# Patient Record
Sex: Female | Born: 1980 | Race: White | Hispanic: No | Marital: Married | Smoking: Former smoker
Health system: Southern US, Community
[De-identification: ages and names within clinical notes are randomized; demographics above are authoritative.]

## PROBLEM LIST (undated history)

## (undated) ENCOUNTER — Inpatient Hospital Stay (HOSPITAL_COMMUNITY): Payer: Self-pay

## (undated) DIAGNOSIS — F419 Anxiety disorder, unspecified: Secondary | ICD-10-CM

## (undated) DIAGNOSIS — K219 Gastro-esophageal reflux disease without esophagitis: Secondary | ICD-10-CM

## (undated) DIAGNOSIS — E039 Hypothyroidism, unspecified: Secondary | ICD-10-CM

## (undated) DIAGNOSIS — O139 Gestational [pregnancy-induced] hypertension without significant proteinuria, unspecified trimester: Secondary | ICD-10-CM

## (undated) DIAGNOSIS — N62 Hypertrophy of breast: Secondary | ICD-10-CM

## (undated) DIAGNOSIS — I7300 Raynaud's syndrome without gangrene: Secondary | ICD-10-CM

## (undated) DIAGNOSIS — R87629 Unspecified abnormal cytological findings in specimens from vagina: Secondary | ICD-10-CM

## (undated) DIAGNOSIS — R519 Headache, unspecified: Secondary | ICD-10-CM

## (undated) HISTORY — DX: Anxiety disorder, unspecified: F41.9

## (undated) HISTORY — DX: Headache, unspecified: R51.9

## (undated) HISTORY — PX: WISDOM TOOTH EXTRACTION: SHX21

## (undated) HISTORY — PX: DILATION AND CURETTAGE OF UTERUS: SHX78

## (undated) HISTORY — DX: Unspecified abnormal cytological findings in specimens from vagina: R87.629

## (undated) HISTORY — PX: CERVICAL BIOPSY  W/ LOOP ELECTRODE EXCISION: SUR135

## (undated) HISTORY — DX: Raynaud's syndrome without gangrene: I73.00

---
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
## 2001-03-12 ENCOUNTER — Emergency Department (HOSPITAL_COMMUNITY): Admission: EM | Admit: 2001-03-12 | Discharge: 2001-03-13 | Payer: Self-pay | Admitting: Emergency Medicine

## 2001-04-22 ENCOUNTER — Emergency Department (HOSPITAL_COMMUNITY): Admission: EM | Admit: 2001-04-22 | Discharge: 2001-04-22 | Payer: Self-pay | Admitting: Emergency Medicine

## 2001-07-17 ENCOUNTER — Emergency Department (HOSPITAL_COMMUNITY): Admission: EM | Admit: 2001-07-17 | Discharge: 2001-07-17 | Payer: Self-pay | Admitting: Emergency Medicine

## 2002-01-07 ENCOUNTER — Emergency Department (HOSPITAL_COMMUNITY): Admission: EM | Admit: 2002-01-07 | Discharge: 2002-01-07 | Payer: Self-pay | Admitting: *Deleted

## 2002-04-21 ENCOUNTER — Other Ambulatory Visit: Admission: RE | Admit: 2002-04-21 | Discharge: 2002-04-21 | Payer: Self-pay | Admitting: Obstetrics and Gynecology

## 2002-12-13 ENCOUNTER — Other Ambulatory Visit: Admission: RE | Admit: 2002-12-13 | Discharge: 2002-12-13 | Payer: Self-pay | Admitting: Obstetrics and Gynecology

## 2003-10-25 ENCOUNTER — Other Ambulatory Visit: Admission: RE | Admit: 2003-10-25 | Discharge: 2003-10-25 | Payer: Self-pay | Admitting: Obstetrics and Gynecology

## 2004-04-26 ENCOUNTER — Other Ambulatory Visit: Admission: RE | Admit: 2004-04-26 | Discharge: 2004-04-26 | Payer: Self-pay | Admitting: Obstetrics and Gynecology

## 2005-02-15 ENCOUNTER — Emergency Department (HOSPITAL_COMMUNITY): Admission: EM | Admit: 2005-02-15 | Discharge: 2005-02-15 | Payer: Self-pay | Admitting: Emergency Medicine

## 2005-06-26 ENCOUNTER — Inpatient Hospital Stay (HOSPITAL_COMMUNITY): Admission: AD | Admit: 2005-06-26 | Discharge: 2005-06-26 | Payer: Self-pay | Admitting: Obstetrics and Gynecology

## 2005-07-31 ENCOUNTER — Other Ambulatory Visit: Admission: RE | Admit: 2005-07-31 | Discharge: 2005-07-31 | Payer: Self-pay | Admitting: Obstetrics and Gynecology

## 2005-09-03 ENCOUNTER — Inpatient Hospital Stay (HOSPITAL_COMMUNITY): Admission: AD | Admit: 2005-09-03 | Discharge: 2005-09-03 | Payer: Self-pay | Admitting: Obstetrics and Gynecology

## 2005-09-24 ENCOUNTER — Inpatient Hospital Stay (HOSPITAL_COMMUNITY): Admission: AD | Admit: 2005-09-24 | Discharge: 2005-09-24 | Payer: Self-pay | Admitting: Obstetrics and Gynecology

## 2005-09-28 ENCOUNTER — Inpatient Hospital Stay (HOSPITAL_COMMUNITY): Admission: AD | Admit: 2005-09-28 | Discharge: 2005-10-02 | Payer: Self-pay | Admitting: Obstetrics and Gynecology

## 2005-10-10 ENCOUNTER — Inpatient Hospital Stay (HOSPITAL_COMMUNITY): Admission: AD | Admit: 2005-10-10 | Discharge: 2005-10-10 | Payer: Self-pay | Admitting: Obstetrics and Gynecology

## 2005-12-22 ENCOUNTER — Other Ambulatory Visit: Admission: RE | Admit: 2005-12-22 | Discharge: 2005-12-22 | Payer: Self-pay | Admitting: Obstetrics and Gynecology

## 2007-01-27 ENCOUNTER — Other Ambulatory Visit: Admission: RE | Admit: 2007-01-27 | Discharge: 2007-01-27 | Payer: Self-pay | Admitting: Family Medicine

## 2007-04-12 ENCOUNTER — Emergency Department (HOSPITAL_COMMUNITY): Admission: EM | Admit: 2007-04-12 | Discharge: 2007-04-12 | Payer: Self-pay | Admitting: Emergency Medicine

## 2009-03-09 ENCOUNTER — Other Ambulatory Visit: Admission: RE | Admit: 2009-03-09 | Discharge: 2009-03-09 | Payer: Self-pay | Admitting: Family Medicine

## 2009-04-16 ENCOUNTER — Encounter: Payer: Self-pay | Admitting: Cardiology

## 2009-05-10 ENCOUNTER — Ambulatory Visit: Payer: Self-pay | Admitting: Cardiology

## 2009-05-10 DIAGNOSIS — F17200 Nicotine dependence, unspecified, uncomplicated: Secondary | ICD-10-CM

## 2009-05-10 DIAGNOSIS — E785 Hyperlipidemia, unspecified: Secondary | ICD-10-CM

## 2009-12-03 ENCOUNTER — Telehealth: Payer: Self-pay | Admitting: Cardiology

## 2009-12-04 ENCOUNTER — Ambulatory Visit: Payer: Self-pay | Admitting: Cardiology

## 2009-12-13 ENCOUNTER — Encounter: Payer: Self-pay | Admitting: Cardiology

## 2009-12-13 LAB — CONVERTED CEMR LAB
AST: 20 units/L (ref 0–37)
Albumin: 4.2 g/dL (ref 3.5–5.2)
Bilirubin, Direct: 0.1 mg/dL (ref 0.0–0.3)
LDL Cholesterol: 99 mg/dL (ref 0–99)
Triglycerides: 83 mg/dL (ref 0.0–149.0)
VLDL: 16.6 mg/dL (ref 0.0–40.0)

## 2010-10-22 NOTE — Letter (Signed)
Summary: Custom - Lipid  [facility], Main Office  [address]   [place], [place] [address]   Phone: [phone]  Fax: [phone]         December 13, 2009 MRN: [id number]     Tracy Tran [address] [place], [place]  [address]     Dear [name],    We have reviewed your cholesterol results.  They are as follows:     Total Cholesterol:    174 (Desirable: less than 200)       HDL  Cholesterol:     58.40  (Desirable: greater than 40 for men and 50 for women)       LDL Cholesterol:       99  (Desirable: less than 100 for low risk and less than 70 for moderate to high risk)       Triglycerides:       83.0  (Desirable: less than 150)  Our recommendations include:  Continue the same.  This is excellent!!   Call our office at the number listed above if you have any questions.  Lowering your LDL cholesterol is important, but it is only one of a large number of "risk factors" that may indicate that you are at risk for heart disease, stroke or other complications of hardening of the arteries.  Other risk factors include:   A.  Cigarette Smoking* B.  High Blood Pressure* C.  Obesity* D.   Low HDL Cholesterol (see yours above)* E.   Diabetes Mellitus (higher risk if your is uncontrolled) F.  Family history of premature heart disease G.  Previous history of stroke or cardiovascular disease        *These are risk factors YOU HAVE CONTROL OVER.  For more information, visit .  There is now evidence that lowering the TOTAL CHOLESTEROL AND LDL CHOLESTEROL can reduce the risk of heart disease.  The American Heart Association recommends the following guidelines for the treatment of elevated cholesterol:  1.  If there is now current heart disease and less than two risk factors, TOTAL CHOLESTEROL should be less than 200 and LDL CHOLESTEROL should be less than 100. 2.  If there is current heart disease or two or more risk factors, TOTAL CHOLESTEROL should  be less than 200 and LDL CHOLESTEROL should be less than 70.  A diet low in cholesterol, saturated fat, and calories is the cornerstone of treatment for elevated cholesterol.  Cessation of smoking and exercise are also important in the management of elevated cholesterol and preventing vascular disease.  Studies have shown that 30 to 60 minutes of physical activity most days can help lower blood pressure, lower cholesterol, and keep your weight at a healthy level.  Drug therapy is used when cholesterol levels do not respond to therapeutic lifestyle changes (smoking cessation, diet, and exercise) and remains unacceptably high.  If medication is started, it is important to have you levels checked periodically to evaluate the need for further treatment options.    Thank you,   Sander Nephew, RN for  Dr Roll[facility] Team

## 2010-10-22 NOTE — Progress Notes (Signed)
Summary: CALLING ABOUT LABS coming in 12/04/2009  Phone Note Call from Patient Call back at Work Phone [phone]   Caller: Patient Summary of Call: PT CALLING WITH QUESTIONS ABOUT HAVING LABS DRAWN FOR LIVER/LIPID Initial call taken by: Judie Grieve,  December 03, 2009 9:54 AM  Follow-up for Phone Call        wants to have fasting lipid and liver profile in am. order will be placed in system.  pt never came in for f/u 8 week labs after starting Lipitor Follow-up by: Charolotte Capuchin, RN,  December 03, 2009 10:08 AM

## 2014-02-28 LAB — OB RESULTS CONSOLE GC/CHLAMYDIA
CHLAMYDIA, DNA PROBE: NEGATIVE
Gonorrhea: NEGATIVE

## 2014-02-28 LAB — OB RESULTS CONSOLE ABO/RH: RH Type: POSITIVE

## 2014-02-28 LAB — OB RESULTS CONSOLE ANTIBODY SCREEN: Antibody Screen: NEGATIVE

## 2014-02-28 LAB — OB RESULTS CONSOLE RUBELLA ANTIBODY, IGM: RUBELLA: IMMUNE

## 2014-02-28 LAB — OB RESULTS CONSOLE HEPATITIS B SURFACE ANTIGEN: HEP B S AG: NEGATIVE

## 2014-02-28 LAB — OB RESULTS CONSOLE HIV ANTIBODY (ROUTINE TESTING): HIV: NONREACTIVE

## 2014-02-28 LAB — OB RESULTS CONSOLE RPR: RPR: NONREACTIVE

## 2014-09-27 ENCOUNTER — Encounter (HOSPITAL_COMMUNITY): Payer: Self-pay | Admitting: *Deleted

## 2014-09-27 ENCOUNTER — Telehealth (HOSPITAL_COMMUNITY): Payer: Self-pay | Admitting: *Deleted

## 2014-09-27 NOTE — Telephone Encounter (Signed)
Preadmission screen  

## 2014-10-01 ENCOUNTER — Inpatient Hospital Stay (HOSPITAL_COMMUNITY)
Admission: RE | Admit: 2014-10-01 | Discharge: 2014-10-03 | DRG: 775 | Disposition: A | Discharge status: Home / Self Care | Payer: BLUE CROSS/BLUE SHIELD | Source: Ambulatory Visit | Attending: Obstetrics and Gynecology | Admitting: Obstetrics and Gynecology

## 2014-10-01 ENCOUNTER — Encounter (HOSPITAL_COMMUNITY): Payer: Self-pay

## 2014-10-01 DIAGNOSIS — Z3A39 39 weeks gestation of pregnancy: Secondary | ICD-10-CM | POA: Diagnosis present

## 2014-10-01 DIAGNOSIS — IMO0002 Reserved for concepts with insufficient information to code with codable children: Secondary | ICD-10-CM | POA: Diagnosis present

## 2014-10-01 DIAGNOSIS — O365930 Maternal care for other known or suspected poor fetal growth, third trimester, not applicable or unspecified: Secondary | ICD-10-CM | POA: Diagnosis present

## 2014-10-01 DIAGNOSIS — Z3483 Encounter for supervision of other normal pregnancy, third trimester: Secondary | ICD-10-CM | POA: Diagnosis present

## 2014-10-01 LAB — CBC
HEMATOCRIT: 33.6 % — AB (ref 36.0–46.0)
HEMOGLOBIN: 11.3 g/dL — AB (ref 12.0–15.0)
MCH: 30.2 pg (ref 26.0–34.0)
MCHC: 33.6 g/dL (ref 30.0–36.0)
MCV: 89.8 fL (ref 78.0–100.0)
PLATELETS: 229 10*3/uL (ref 150–400)
RBC: 3.74 MIL/uL — ABNORMAL LOW (ref 3.87–5.11)
RDW: 13.1 % (ref 11.5–15.5)
WBC: 12.4 10*3/uL — AB (ref 4.0–10.5)

## 2014-10-01 LAB — ABO/RH: ABO/RH(D): AB POS

## 2014-10-01 LAB — TYPE AND SCREEN
ABO/RH(D): AB POS
Antibody Screen: NEGATIVE

## 2014-10-01 LAB — OB RESULTS CONSOLE GBS: STREP GROUP B AG: NEGATIVE

## 2014-10-01 MED ORDER — CITRIC ACID-SODIUM CITRATE 334-500 MG/5ML PO SOLN: 30.0000 mL | ORAL | Status: DC | PRN

## 2014-10-01 MED ORDER — TERBUTALINE SULFATE 1 MG/ML IJ SOLN: 0.2500 mg | Freq: Once | INTRAMUSCULAR | Status: AC | PRN

## 2014-10-01 MED ORDER — OXYCODONE-ACETAMINOPHEN 5-325 MG PO TABS: 2.0000 | ORAL_TABLET | ORAL | Status: DC | PRN

## 2014-10-01 MED ORDER — OXYTOCIN 40 UNITS IN LACTATED RINGERS INFUSION - SIMPLE MED
62.5000 mL/h | INTRAVENOUS | Status: DC
  Administered 2014-10-02: 62.5 mL/h via INTRAVENOUS
  Filled 2014-10-01: qty 1000

## 2014-10-01 MED ORDER — MISOPROSTOL 25 MCG QUARTER TABLET
25.0000 ug | ORAL_TABLET | ORAL | Status: DC | PRN
  Administered 2014-10-01 – 2014-10-02 (×2): 25 ug via VAGINAL
  Filled 2014-10-01: qty 1
  Filled 2014-10-01 (×2): qty 0.25

## 2014-10-01 MED ORDER — LACTATED RINGERS IV SOLN: 500.0000 mL | INTRAVENOUS | Status: DC | PRN

## 2014-10-01 MED ORDER — OXYTOCIN BOLUS FROM INFUSION
500.0000 mL | INTRAVENOUS | Status: DC
  Administered 2014-10-02: 500 mL via INTRAVENOUS

## 2014-10-01 MED ORDER — ACETAMINOPHEN 325 MG PO TABS: 650.0000 mg | ORAL_TABLET | ORAL | Status: DC | PRN

## 2014-10-01 MED ORDER — FLEET ENEMA 7-19 GM/118ML RE ENEM: 1.0000 | ENEMA | RECTAL | Status: DC | PRN

## 2014-10-01 MED ORDER — OXYCODONE-ACETAMINOPHEN 5-325 MG PO TABS: 1.0000 | ORAL_TABLET | ORAL | Status: DC | PRN

## 2014-10-01 MED ORDER — ZOLPIDEM TARTRATE 5 MG PO TABS: 5.0000 mg | ORAL_TABLET | Freq: Every evening | ORAL | Status: DC | PRN

## 2014-10-01 MED ORDER — LACTATED RINGERS IV SOLN
INTRAVENOUS | Status: DC
  Administered 2014-10-01: 20:00:00 via INTRAVENOUS

## 2014-10-01 MED ORDER — LIDOCAINE HCL (PF) 1 % IJ SOLN
30.0000 mL | INTRAMUSCULAR | Status: DC | PRN
  Filled 2014-10-01: qty 30

## 2014-10-01 MED ORDER — ONDANSETRON HCL 4 MG/2ML IJ SOLN: 4.0000 mg | Freq: Four times a day (QID) | INTRAMUSCULAR | Status: DC | PRN

## 2014-10-02 ENCOUNTER — Inpatient Hospital Stay (HOSPITAL_COMMUNITY): Payer: BLUE CROSS/BLUE SHIELD | Admitting: Anesthesiology

## 2014-10-02 ENCOUNTER — Encounter (HOSPITAL_COMMUNITY): Payer: Self-pay

## 2014-10-02 MED ORDER — DIPHENHYDRAMINE HCL 25 MG PO CAPS: 25.0000 mg | ORAL_CAPSULE | Freq: Four times a day (QID) | ORAL | Status: DC | PRN

## 2014-10-02 MED ORDER — LACTATED RINGERS IV SOLN: 500.0000 mL | Freq: Once | INTRAVENOUS | Status: DC

## 2014-10-02 MED ORDER — ZOLPIDEM TARTRATE 5 MG PO TABS: 5.0000 mg | ORAL_TABLET | Freq: Every evening | ORAL | Status: DC | PRN

## 2014-10-02 MED ORDER — PHENYLEPHRINE 40 MCG/ML (10ML) SYRINGE FOR IV PUSH (FOR BLOOD PRESSURE SUPPORT)
80.0000 ug | PREFILLED_SYRINGE | INTRAVENOUS | Status: DC | PRN
Start: 2014-10-02 — End: 2014-10-02
  Filled 2014-10-02: qty 2

## 2014-10-02 MED ORDER — EPHEDRINE 5 MG/ML INJ
10.0000 mg | INTRAVENOUS | Status: DC | PRN
  Filled 2014-10-02: qty 2

## 2014-10-02 MED ORDER — PRENATAL MULTIVITAMIN CH
1.0000 | ORAL_TABLET | Freq: Every day | ORAL | Status: DC
  Administered 2014-10-03: 1 via ORAL
  Filled 2014-10-02: qty 1

## 2014-10-02 MED ORDER — IBUPROFEN 600 MG PO TABS
600.0000 mg | ORAL_TABLET | Freq: Four times a day (QID) | ORAL | Status: DC
  Administered 2014-10-02 – 2014-10-03 (×5): 600 mg via ORAL
  Filled 2014-10-02 (×5): qty 1

## 2014-10-02 MED ORDER — DIBUCAINE 1 % RE OINT: 1.0000 "application " | TOPICAL_OINTMENT | RECTAL | Status: DC | PRN

## 2014-10-02 MED ORDER — OXYCODONE-ACETAMINOPHEN 5-325 MG PO TABS: 1.0000 | ORAL_TABLET | ORAL | Status: DC | PRN

## 2014-10-02 MED ORDER — SIMETHICONE 80 MG PO CHEW: 80.0000 mg | CHEWABLE_TABLET | ORAL | Status: DC | PRN

## 2014-10-02 MED ORDER — BISACODYL 10 MG RE SUPP: 10.0000 mg | Freq: Every day | RECTAL | Status: DC | PRN

## 2014-10-02 MED ORDER — ONDANSETRON HCL 4 MG PO TABS: 4.0000 mg | ORAL_TABLET | ORAL | Status: DC | PRN

## 2014-10-02 MED ORDER — FLEET ENEMA 7-19 GM/118ML RE ENEM: 1.0000 | ENEMA | Freq: Every day | RECTAL | Status: DC | PRN

## 2014-10-02 MED ORDER — DIPHENHYDRAMINE HCL 50 MG/ML IJ SOLN: 12.5000 mg | INTRAMUSCULAR | Status: DC | PRN

## 2014-10-02 MED ORDER — LANOLIN HYDROUS EX OINT: TOPICAL_OINTMENT | CUTANEOUS | Status: DC | PRN

## 2014-10-02 MED ORDER — WITCH HAZEL-GLYCERIN EX PADS: 1.0000 "application " | MEDICATED_PAD | CUTANEOUS | Status: DC | PRN

## 2014-10-02 MED ORDER — ONDANSETRON HCL 4 MG/2ML IJ SOLN: 4.0000 mg | INTRAMUSCULAR | Status: DC | PRN

## 2014-10-02 MED ORDER — BENZOCAINE-MENTHOL 20-0.5 % EX AERO
1.0000 "application " | INHALATION_SPRAY | CUTANEOUS | Status: DC | PRN
  Administered 2014-10-02: 1 via TOPICAL
  Filled 2014-10-02: qty 56

## 2014-10-02 MED ORDER — OXYCODONE-ACETAMINOPHEN 5-325 MG PO TABS: 2.0000 | ORAL_TABLET | ORAL | Status: DC | PRN

## 2014-10-02 MED ORDER — SENNOSIDES-DOCUSATE SODIUM 8.6-50 MG PO TABS
2.0000 | ORAL_TABLET | ORAL | Status: DC
  Administered 2014-10-03: 2 via ORAL
  Filled 2014-10-02: qty 2

## 2014-10-02 MED ORDER — LIDOCAINE HCL (PF) 1 % IJ SOLN
INTRAMUSCULAR | Status: DC | PRN
Start: 2014-10-02 — End: 2014-10-03
  Administered 2014-10-02 (×2): 5 mL

## 2014-10-02 MED ORDER — FENTANYL 2.5 MCG/ML BUPIVACAINE 1/10 % EPIDURAL INFUSION (WH - ANES)
14.0000 mL/h | INTRAMUSCULAR | Status: DC | PRN
  Administered 2014-10-02: 14 mL/h via EPIDURAL
  Filled 2014-10-02: qty 125

## 2014-10-02 MED ORDER — PHENYLEPHRINE 40 MCG/ML (10ML) SYRINGE FOR IV PUSH (FOR BLOOD PRESSURE SUPPORT)
80.0000 ug | PREFILLED_SYRINGE | INTRAVENOUS | Status: DC | PRN
  Filled 2014-10-02: qty 20
  Filled 2014-10-02: qty 2

## 2014-10-02 MED ORDER — TETANUS-DIPHTH-ACELL PERTUSSIS 5-2.5-18.5 LF-MCG/0.5 IM SUSP
0.5000 mL | Freq: Once | INTRAMUSCULAR | Status: DC
Start: 2014-10-03 — End: 2014-10-03

## 2014-10-02 NOTE — Progress Notes (Signed)
Delivery Note At 10:57 AM a viable female was delivered via  (Presentation:LOA ;  ).  APGAR: , ; weight  .   Placenta status:intact , .  Cord: 3 vessels with the following complications:loose nuchal cord x1 .  Cord pH: pending  Anesthesia:   Episiotomy:   Lacerations: second degree midline lac Suture Repair: 2.0 vicryl rapide Est. Blood Loss (mL):    Mom to postpartum.  Baby to Couplet care / Skin to Skin.  Glenisha Gundry II,Phoua Hoadley E 10/02/2014, 11:12 AM

## 2014-10-02 NOTE — Anesthesia Preprocedure Evaluation (Signed)
Anesthesia Evaluation  Patient identified by MRN, date of birth, ID band Patient awake    Reviewed: Allergy & Precautions, H&P , Patient's Chart, lab work & pertinent test results  Airway Mallampati: II TM Distance: >3 FB Neck ROM: full    Dental   Pulmonary  breath sounds clear to auscultation        Cardiovascular Rhythm:regular Rate:Normal     Neuro/Psych PSYCHIATRIC DISORDERS    GI/Hepatic   Endo/Other    Renal/GU      Musculoskeletal   Abdominal   Peds  Hematology   Anesthesia Other Findings   Reproductive/Obstetrics (+) Pregnancy                           Anesthesia Physical Anesthesia Plan  ASA: II  Anesthesia Plan: Epidural   Post-op Pain Management:    Induction:   Airway Management Planned:   Additional Equipment:   Intra-op Plan:   Post-operative Plan:   Informed Consent: I have reviewed the patients History and Physical, chart, labs and discussed the procedure including the risks, benefits and alternatives for the proposed anesthesia with the patient or authorized representative who has indicated his/her understanding and acceptance.     Plan Discussed with:   Anesthesia Plan Comments:         Anesthesia Quick Evaluation  

## 2014-10-02 NOTE — Anesthesia Postprocedure Evaluation (Signed)
  Anesthesia Post-op Note  Patient: Tracy GordonHannah M Byrd  Procedure(s) Performed: * No procedures listed *  Patient Location: PACU and Mother/Baby  Anesthesia Type:Epidural  Level of Consciousness: awake, alert  and oriented  Airway and Oxygen Therapy: Patient Spontanous Breathing  Post-op Pain: none  Post-op Assessment: Post-op Vital signs reviewed  Post-op Vital Signs: Reviewed and stable  Last Vitals:  Filed Vitals:   10/02/14 1504  BP: 108/64  Pulse: 84  Temp: 36.3 C  Resp: 16    Complications: No apparent anesthesia complications

## 2014-10-02 NOTE — H&P (Signed)
Tracy GordonHannah M Tran is a 34 y.o. female presenting for IOL secondary to IUGR with EFW @ 8% (5# 8oz) on 09/21/14. Dopplers and BPP normal. Maternal Medical History:  Fetal activity: Perceived fetal activity is normal.      OB History    Gravida Para Term Preterm AB TAB SAB Ectopic Multiple Living   3 1 1  1     1      Past Medical History  Diagnosis Date  . Vaginal Pap smear, abnormal   . Anxiety    Past Surgical History  Procedure Laterality Date  . Dilation and curettage of uterus     Family History: family history includes Diabetes in her maternal grandfather; Heart disease in her father and mother; Hypertension in her father and mother; Thyroid disease in her mother. Social History:  reports that she has never smoked. She has never used smokeless tobacco. She reports that she does not drink alcohol or use illicit drugs.   Prenatal Transfer Tool  Maternal Diabetes: No Genetic Screening: Normal Maternal Ultrasounds/Referrals: Normal Fetal Ultrasounds or other Referrals:  None Maternal Substance Abuse:  No Significant Maternal Medications:  None Significant Maternal Lab Results:  None Other Comments:  None  Review of Systems  Eyes: Negative for blurred vision.  Gastrointestinal: Negative for abdominal pain.  Neurological: Negative for headaches.    Dilation: 2 Effacement (%): Thick Station: -2 Exam by:: ansah-mensah, rnc Blood pressure 98/83, pulse 88, temperature 98.5 F (36.9 C), temperature source Oral, resp. rate 16, height 5\' 7"  (1.702 m), weight 177 lb (80.287 kg), last menstrual period 12/29/2013. Maternal Exam:  Uterine Assessment: Contraction strength is moderate.  Contraction frequency is irregular.   Abdomen: Fetal presentation: vertex     Fetal Exam Fetal State Assessment: Category I - tracings are normal.     Physical Exam  Cardiovascular: Normal rate and regular rhythm.   Respiratory: Effort normal and breath sounds normal.  GI: Soft. There is no  tenderness.  Neurological: She has normal reflexes.    Cx 3/80/-2/vtx + show AROM clear, trickle due to well applied vtx  Prenatal labs: ABO, Rh: --/--/AB POS, AB POS (01/10 2030) Antibody: NEG (01/10 2030) Rubella: Immune (06/09 0000) RPR: Nonreactive (06/09 0000)  HBsAg: Negative (06/09 0000)  HIV: Non-reactive (06/09 0000)  GBS: Negative (01/10 0000)   Assessment/Plan: 34 yo with IUGR @ 39 4/7 weeks S/P cytotec x 2 last pm Reviewed with patient and husband induction and risks including fetal distress, emergency cesarean section Patient states she understands and agrees, all questions answered.   Shermaine Rivet II,Dayja Loveridge E 10/02/2014, 8:41 AM

## 2014-10-02 NOTE — Lactation Note (Signed)
This note was copied from the chart of Tracy Tran. Lactation Consultation Note Initial visit at 11 hours of age.  Mom reports several good feedings.  Mom denies pain with latch.  [facility] given and discussed.  Encouraged to feed with early cues on demand.  Early newborn behavior discussed.  Hand expression demonstrated by mom with colostrum visible.  Mom to call for assist as needed.    Patient Name: Tracy Tran UJWJX'BToday's Date: 10/02/2014 Reason for consult: Initial assessment   Maternal Data Has patient been taught Hand Expression?: Yes Does the patient have breastfeeding experience prior to this delivery?: Yes  Feeding Feeding Type: Breast Fed Length of feed: 10 min  LATCH Score/Interventions                Intervention(s): Breastfeeding basics reviewed     Lactation Tools Discussed/Used     Consult Status Consult Status: Follow-up Date: 10/03/14 Follow-up type: In-patient    Beverely RisenShoptaw, Arvella MerlesJana Lynn 10/02/2014, 10:19 PM

## 2014-10-02 NOTE — Anesthesia Procedure Notes (Signed)
Epidural Patient location during procedure: OB Start time: 10/02/2014 10:20 AM  Staffing Anesthesiologist: Brayton CavesJACKSON, Laterria Lasota Performed by: anesthesiologist   Preanesthetic Checklist Completed: patient identified, site marked, surgical consent, pre-op evaluation, timeout performed, IV checked, risks and benefits discussed and monitors and equipment checked  Epidural Patient position: sitting Prep: site prepped and draped and DuraPrep Patient monitoring: continuous pulse ox and blood pressure Approach: midline Location: L3-L4 Injection technique: LOR air  Needle:  Needle type: Tuohy  Needle gauge: 17 G Needle length: 9 cm and 9 Needle insertion depth: 5 cm cm Catheter type: closed end flexible Catheter size: 19 Gauge Catheter at skin depth: 10 cm Test dose: negative  Assessment Events: blood not aspirated, injection not painful, no injection resistance, negative IV test and no paresthesia  Additional Notes Patient identified.  Risk benefits discussed including failed block, incomplete pain control, headache, nerve damage, paralysis, blood pressure changes, nausea, vomiting, reactions to medication both toxic or allergic, and postpartum back pain.  Patient expressed understanding and wished to proceed.  All questions were answered.  Sterile technique used throughout procedure and epidural site dressed with sterile barrier dressing. No paresthesia or other complications noted.The patient did not experience any signs of intravascular injection such as tinnitus or metallic taste in mouth nor signs of intrathecal spread such as rapid motor block. Please see nursing notes for vital signs.

## 2014-10-03 LAB — CBC
HCT: 29.4 % — ABNORMAL LOW (ref 36.0–46.0)
Hemoglobin: 9.9 g/dL — ABNORMAL LOW (ref 12.0–15.0)
MCH: 30.4 pg (ref 26.0–34.0)
MCHC: 33.7 g/dL (ref 30.0–36.0)
MCV: 90.2 fL (ref 78.0–100.0)
Platelets: 198 10*3/uL (ref 150–400)
RBC: 3.26 MIL/uL — ABNORMAL LOW (ref 3.87–5.11)
RDW: 13.2 % (ref 11.5–15.5)
WBC: 13.4 10*3/uL — AB (ref 4.0–10.5)

## 2014-10-03 LAB — RPR: RPR: NONREACTIVE

## 2014-10-03 MED ORDER — IBUPROFEN 600 MG PO TABS: 600.0000 mg | ORAL_TABLET | Freq: Four times a day (QID) | ORAL | Status: DC

## 2014-10-03 NOTE — Discharge Summary (Signed)
Obstetric Discharge Summary Reason for Admission: induction of labor Prenatal Procedures: ultrasound Intrapartum Procedures: spontaneous vaginal delivery Postpartum Procedures: none Complications-Operative and Postpartum: 2 degree perineal laceration HEMOGLOBIN  Date Value Ref Range Status  10/03/2014 9.9* 12.0 - 15.0 g/dL Final   HCT  Date Value Ref Range Status  10/03/2014 29.4* 36.0 - 46.0 % Final    Physical Exam:  General: alert and cooperative Lochia: appropriate Uterine Fundus: firm Incision: healing well DVT Evaluation: No evidence of DVT seen on physical exam. Negative Homan's sign. No cords or calf tenderness. No significant calf/ankle edema.  Discharge Diagnoses: Term Pregnancy-delivered  Discharge Information: Date: 10/03/2014 Activity: pelvic rest Diet: routine Medications: PNV and Ibuprofen Condition: stable Instructions: refer to practice specific booklet Discharge to: home   Newborn Data: Live born female  Birth Weight: 6 lb 4 oz (2835 g) APGAR: 9, 9  Home with mother.  Maynard David G 10/03/2014, 8:20 AM

## 2014-10-03 NOTE — Progress Notes (Signed)
Mom states that breastfeeding in going well, mom encouraged to call out for help.  Every time the nurse goes in to see a feeding mom states that she just finished or just come off.  Not able to see a latch this shift.  Encouraged pt to call lactation if any questions arise once at home.

## 2014-10-03 NOTE — Progress Notes (Signed)
Post Partum Day 1 Subjective: no complaints, up ad lib, voiding, tolerating PO and + flatus  Objective: Blood pressure 98/52, pulse 79, temperature 98.5 F (36.9 C), temperature source Oral, resp. rate 16, height 5\' 7"  (1.702 m), weight 177 lb (80.287 kg), last menstrual period 12/29/2013, SpO2 98 %, unknown if currently breastfeeding.  Physical Exam:  General: alert and cooperative Lochia: appropriate Uterine Fundus: firm Incision: healing well DVT Evaluation: No evidence of DVT seen on physical exam. Negative Homan's sign. No cords or calf tenderness. No significant calf/ankle edema.   Recent Labs  10/01/14 2030 10/03/14 0604  HGB 11.3* 9.9*  HCT 33.6* 29.4*    Assessment/Plan: Discharge home   LOS: 2 days   Toneshia Coello G 10/03/2014, 8:14 AM

## 2014-10-03 NOTE — Lactation Note (Signed)
This note was copied from the chart of Tracy Tran. Lactation Consultation Note: Follow up visit with mom before DC. Mom has baby in her arms and states baby has just finished feeding for 10 min. Mom has not let RN observe a feeding since yesterday afternoon. Reports baby has been nursing well- better today than yesterday.Reports some pain with initial latch but eases off after a few minutes. Offered assist but mom states she is sleeping now. No questions at present. Reviewed OP appointments and BFSG as resources for support after DC. To call prn.   Patient Name: Tracy Tran WUJWJ'XToday's Date: 10/03/2014 Reason for consult: Follow-up assessment   Maternal Data Formula Feeding for Exclusion: No Does the patient have breastfeeding experience prior to this delivery?: Yes  Feeding   LATCH Score/Interventions                      Lactation Tools Discussed/Used     Consult Status Consult Status: Complete    Pamelia HoitWeeks, Kemal Amores D 10/03/2014, 11:32 AM

## 2014-10-03 NOTE — Progress Notes (Signed)
Clinical Social Work Department PSYCHOSOCIAL ASSESSMENT - MATERNAL/CHILD 10/03/2014  Patient:  Tracy Tran, Tracy Tran  Account Number:  [id number]  [place] Date:  10/01/2014  Ardine Eng Name:   [facility]   Clinical Social Worker:  Lucita Ferrara, CLINICAL SOCIAL WORKER   Date/Time:  10/03/2014 09:00 AM  Date Referred:  10/03/2014   Referral source  Central Nursery     Referred reason  Depression/Anxiety   Other referral source:    I:  FAMILY / HOME ENVIRONMENT Child's legal guardian:  PARENT  Guardian - Name Guardian - Age Guardian - Address  [address] [place], [place] [address]  Satya Colello  same as above   Other household support members/support persons Name Relationship DOB  Doree Barthel 33 years old   Other support:   MOB reported that their families live out of state, but shared that she is satisfied with her current level of support which includes her friends.    II  PSYCHOSOCIAL DATA Information Source:  Family Interview  Occupational hygienist Employment:   MOB and FOB reported that they are both well supported by their employers.   Financial resources:  Multimedia programmer If [place]:    School / Grade:  N/A Music therapist / Child Services Coordination / Early Interventions:   None reported  Cultural issues impacting care:   None reported    III  STRENGTHS Strengths  Adequate Resources  Home prepared for Child (including basic supplies)  Supportive family/friends   Strength comment:    IV  RISK FACTORS AND CURRENT PROBLEMS Current Problem:  YES   Risk Factor & Current Problem Patient Issue Family Issue Risk Factor / Current Problem Comment  Mental Illness Y N MOB presents with history of anxiety and panic disorders.  She stopped Zoloft with positive UPT, but denied any symptoms during the pregnancy.    V  SOCIAL WORK ASSESSMENT CSW met with the MOB due to history of anxiety and panic attacks.  MOB provided consent  for the FOB and her 66 year old daughter to be present for the visit.  The MOB presented as easily engaged.  She was observed to be breast feeding the baby during the entire visit and displayed a bright and cheerful affect.  MOB did not present with any acute mental health symptoms, and her thought process did not exhibit any acute anxiety.    Per MOB, she was originally overwhelmed when she learned that she was pregnant since it was "unplanned" and it had been numerous years since she has had a newborn. She stated that it was originally difficult for her to adjust to need to "restart" with a new child.  Per MOB, these are the thoughts and stressors that led to her originally feeling "disconnected" from her pregnancy (as noted in her OB records). She stated that she has since adjusted to her new normal given the support from her friends.  MOB expressed that she is now looking forward to the baby's arrival.  Per MOB, the home is prepared and she is satisfied with their level of support that they receive from family, friends, and their employers..    MOB acknowledged history of anxiety and panic attacks.  She minimized her symptoms and did not clarify exact extent of panic attacks.  She denied any panic attacks or anxiety during her pregnancy, but did endorse Zoloft until she had a positive UPT.  MOB shared that she is not intending to restart her Zoloft unless  needed.  MOB reported history of postpartum depression but shared belief that it was also due to being young and a first time mother.  She denied any concerns about developing postpartum depression since she is more confident in her parenting skills and feels more "stable".  MOB acknowledged increased risk for developing PPD given her mental health history and history of PPD.  She reported intention to closely monitor her mood and to reach out to her MD if she notes increase in anxiety or postpartum depression.   MOB denied additional questions or  concerns, and expressed appreciation for the visit.   No barriers to discharge.    VI SOCIAL WORK PLAN Social Work Therapist, art  No Further Intervention Required / No Barriers to Discharge   Type of pt/family education:   Postpartum depression   If child protective services report - county:  N/A If child protective services report - date:  N/A Information/referral to community resources comment:   MOB declined need for referral, and reported intention to follow up with her family practice MD if she needs new rx for Zoloft.   Other social work plan:   CSW to follow up PRN.

## 2015-12-11 LAB — OB RESULTS CONSOLE RPR: RPR: NONREACTIVE

## 2015-12-11 LAB — OB RESULTS CONSOLE GC/CHLAMYDIA
Chlamydia: NEGATIVE
GC PROBE AMP, GENITAL: NEGATIVE

## 2015-12-11 LAB — OB RESULTS CONSOLE HIV ANTIBODY (ROUTINE TESTING): HIV: NONREACTIVE

## 2015-12-11 LAB — OB RESULTS CONSOLE HEPATITIS B SURFACE ANTIGEN: Hepatitis B Surface Ag: NEGATIVE

## 2015-12-11 LAB — OB RESULTS CONSOLE RUBELLA ANTIBODY, IGM: Rubella: IMMUNE

## 2015-12-11 LAB — OB RESULTS CONSOLE ABO/RH: RH TYPE: POSITIVE

## 2015-12-11 LAB — OB RESULTS CONSOLE ANTIBODY SCREEN: Antibody Screen: NEGATIVE

## 2016-06-11 ENCOUNTER — Inpatient Hospital Stay (HOSPITAL_COMMUNITY)
Admission: AD | Admit: 2016-06-11 | Discharge: 2016-06-12 | Disposition: A | Discharge status: Home / Self Care | Payer: Medicaid Other | Source: Ambulatory Visit | Attending: Obstetrics and Gynecology | Admitting: Obstetrics and Gynecology

## 2016-06-11 ENCOUNTER — Encounter (HOSPITAL_COMMUNITY): Payer: Self-pay | Admitting: *Deleted

## 2016-06-11 DIAGNOSIS — O368130 Decreased fetal movements, third trimester, not applicable or unspecified: Secondary | ICD-10-CM | POA: Insufficient documentation

## 2016-06-11 DIAGNOSIS — Z3A35 35 weeks gestation of pregnancy: Secondary | ICD-10-CM | POA: Insufficient documentation

## 2016-06-11 DIAGNOSIS — O4703 False labor before 37 completed weeks of gestation, third trimester: Secondary | ICD-10-CM | POA: Insufficient documentation

## 2016-06-11 DIAGNOSIS — O479 False labor, unspecified: Secondary | ICD-10-CM

## 2016-06-11 DIAGNOSIS — Z20828 Contact with and (suspected) exposure to other viral communicable diseases: Secondary | ICD-10-CM

## 2016-06-11 NOTE — MAU Note (Signed)
Pt reports decrease movement all day today. Pt reports that she ate some chocolate before coming to MAU and now is feeling movements.Pt also states that she is having pain that feels like constant contractions that are not going away. Pt was concerned because the baby has a single umbilical arty and she has a history of a fast labor.

## 2016-06-11 NOTE — MAU Note (Signed)
Pt reports abd pain for the last 6 hours, has been off/on for several days. Also reports decreased fetal movement.

## 2016-06-12 DIAGNOSIS — Z3A35 35 weeks gestation of pregnancy: Secondary | ICD-10-CM | POA: Diagnosis not present

## 2016-06-12 DIAGNOSIS — O4703 False labor before 37 completed weeks of gestation, third trimester: Secondary | ICD-10-CM

## 2016-06-12 DIAGNOSIS — O368130 Decreased fetal movements, third trimester, not applicable or unspecified: Secondary | ICD-10-CM

## 2016-06-12 LAB — URINALYSIS, ROUTINE W REFLEX MICROSCOPIC
Bilirubin Urine: NEGATIVE
Glucose, UA: NEGATIVE mg/dL
HGB URINE DIPSTICK: NEGATIVE
KETONES UR: NEGATIVE mg/dL
Leukocytes, UA: NEGATIVE
NITRITE: NEGATIVE
PROTEIN: NEGATIVE mg/dL
Specific Gravity, Urine: <1.005 — ABNORMAL LOW (ref 1.005–1.030)
pH: 6.5 (ref 5.0–8.0)

## 2016-06-12 NOTE — Discharge Instructions (Signed)
Braxton Hicks Contractions °Contractions of the uterus can occur throughout pregnancy. Contractions are not always a sign that you are in labor.  °WHAT ARE BRAXTON HICKS CONTRACTIONS?  °Contractions that occur before labor are called Braxton Hicks contractions, or false labor. Toward the end of pregnancy (32-34 weeks), these contractions can develop more often and may become more forceful. This is not true labor because these contractions do not result in opening (dilatation) and thinning of the cervix. They are sometimes difficult to tell apart from true labor because these contractions can be forceful and people have different pain tolerances. You should not feel embarrassed if you go to the hospital with false labor. Sometimes, the only way to tell if you are in true labor is for your health care provider to look for changes in the cervix. °If there are no prenatal problems or other health problems associated with the pregnancy, it is completely safe to be sent home with false labor and await the onset of true labor. °HOW CAN YOU TELL THE DIFFERENCE BETWEEN TRUE AND FALSE LABOR? °False Labor °· The contractions of false labor are usually shorter and not as hard as those of true labor.   °· The contractions are usually irregular.   °· The contractions are often felt in the front of the lower abdomen and in the groin.   °· The contractions may go away when you walk around or change positions while lying down.   °· The contractions get weaker and are shorter lasting as time goes on.   °· The contractions do not usually become progressively stronger, regular, and closer together as with true labor.   °True Labor °· Contractions in true labor last 30-70 seconds, become very regular, usually become more intense, and increase in frequency.   °· The contractions do not go away with walking.   °· The discomfort is usually felt in the top of the uterus and spreads to the lower abdomen and low back.   °· True labor can be  determined by your health care provider with an exam. This will show that the cervix is dilating and getting thinner.   °WHAT TO REMEMBER °· Keep up with your usual exercises and follow other instructions given by your health care provider.   °· Take medicines as directed by your health care provider.   °· Keep your regular prenatal appointments.   °· Eat and drink lightly if you think you are going into labor.   °· If Braxton Hicks contractions are making you uncomfortable:   °¨ Change your position from lying down or resting to walking, or from walking to resting.   °¨ Sit and rest in a tub of warm water.   °¨ Drink 2-3 glasses of water. Dehydration may cause these contractions.   °¨ Do slow and deep breathing several times an hour.   °WHEN SHOULD I SEEK IMMEDIATE MEDICAL CARE? °Seek immediate medical care if: °· Your contractions become stronger, more regular, and closer together.   °· You have fluid leaking or gushing from your vagina.   °· You have a fever.   °· You pass blood-tinged mucus.   °· You have vaginal bleeding.   °· You have continuous abdominal pain.   °· You have low back pain that you never had before.   °· You feel your baby's head pushing down and causing pelvic pressure.   °· Your baby is not moving as much as it used to.   °  °This information is not intended to replace advice given to you by your health care provider. Make sure you discuss any questions you have with your health care   provider. °  °Document Released: 09/08/2005 Document Revised: 09/13/2013 Document Reviewed: 06/20/2013 °Elsevier Interactive Patient Education ©2016 [employer] ° °

## 2016-06-12 NOTE — MAU Provider Note (Signed)
Chief Complaint:  Decreased Fetal Movement and Abdominal Pain   First Provider Initiated Contact with Patient 06/12/16 0058     HPI: Tracy GordonHannah M Tran is a 35 y.o. [id number] at 3269w5d who presents to maternity admissions reporting decreased fetal movement and intermittent abdominal pain 5 hours. Also reports that her 6036-month-old child has hand foot mouth disease. Child's symptoms started 5 days ago. Patient denies any symptoms. .  Location: Low abdomen Quality: Tightening Severity: Mild Duration: Approximately 5 hours Context: None Timing: Intermittent Modifying factors: None Associated signs and symptoms: Negative for fever, chills, vaginal bleeding, leaking of fluid, vaginal discharge, urinary complaints or GI complaints.  Pregnancy Course: Uncomplicated  Past Medical History: Past Medical History:  Diagnosis Date  . Anxiety   . Vaginal Pap smear, abnormal     Past obstetric history: OB History  Gravida Para Term Preterm AB Living  4 2 2   1 2   SAB TAB Ectopic Multiple Live Births        0 2    # Outcome Date GA Lbr Len/2nd Weight Sex Delivery Anes PTL Lv  4 Current           3 Term 10/02/14 [redacted]w[redacted]d 01:43 / 00:14 6 lb 4 oz (2.835 kg) F Vag-Spont EPI  LIV  2 Term 2007 4033w0d  6 lb 2 oz (2.778 kg) F Vag-Spont EPI  LIV  1 AB 2003              Past Surgical History: Past Surgical History:  Procedure Laterality Date  . DILATION AND CURETTAGE OF UTERUS       Family History: Family History  Problem Relation Age of Onset  . Heart disease Father   . Hypertension Father   . Heart disease Mother   . Hypertension Mother   . Thyroid disease Mother   . Diabetes Maternal Grandfather     Social History: Social History  Substance Use Topics  . Smoking status: Never Smoker  . Smokeless tobacco: Never Used  . Alcohol use No    Allergies: No Known Allergies  Meds:  Prescriptions Prior to Admission  Medication Sig Dispense Refill Last Dose  . prenatal vitamin w/FE, FA  (PRENATAL 1 + 1) 27-1 MG TABS tablet Take 1 tablet by mouth every morning.   12 06/10/2016 at Unknown time  . sertraline (ZOLOFT) 50 MG tablet Take 50 mg by mouth daily.   06/11/2016 at Unknown time  . ibuprofen (ADVIL,MOTRIN) 600 MG tablet Take 1 tablet (600 mg total) by mouth every 6 (six) hours. 30 tablet 1     I have reviewed patient's Past Medical Hx, Surgical Hx, Family Hx, Social Hx, medications and allergies.   ROS:  Review of Systems  Constitutional: Negative for chills and fever.  Gastrointestinal: Positive for abdominal pain. Negative for constipation, diarrhea, nausea and vomiting.  Genitourinary: Negative for dysuria, hematuria, vaginal bleeding and vaginal discharge.  Musculoskeletal: Negative for back pain.    Physical Exam  Patient Vitals for the past 24 hrs:  BP Temp Temp src Pulse Resp SpO2 Height Weight  06/12/16 0045 126/77 - - 95 16 - - -  06/11/16 2231 130/80 98.4 F (36.9 C) Oral 84 18 100 % 5\' 7"  (1.702 m) 192 lb (87.1 kg)   Constitutional: Well-developed, well-nourished female in no acute distress.  Cardiovascular: normal rate Respiratory: normal effort GI: Abd soft, non-tender, gravid appropriate for gestational age. Mild contractions palpated. MS: Extremities nontender, no edema, normal ROM Neurologic: Alert and oriented x  4.  GU:  Pelvic: NEFG, physiologic discharge, no blood.  Dilation: Fingertip Effacement (%): Thick Cervical Position: Posterior Station: Ballotable Presentation: Undeterminable Exam by:: Dorathy Kinsman, CNM  FHT:  Baseline 120 , moderate variability, accelerations present, no decelerations Contractions: 3-10, mild   Labs: Results for orders placed or performed during the hospital encounter of 06/11/16 (from the past 24 hour(s))  Urinalysis, Routine w reflex microscopic (not at [facility])     Status: Abnormal   Collection Time: 06/11/16 11:34 PM  Result Value Ref Range   Color, Urine YELLOW YELLOW   APPearance CLEAR CLEAR   Specific  Gravity, Urine <1.005 (L) 1.005 - 1.030   pH 6.5 5.0 - 8.0   Glucose, UA NEGATIVE NEGATIVE mg/dL   Hgb urine dipstick NEGATIVE NEGATIVE   Bilirubin Urine NEGATIVE NEGATIVE   Ketones, ur NEGATIVE NEGATIVE mg/dL   Protein, ur NEGATIVE NEGATIVE mg/dL   Nitrite NEGATIVE NEGATIVE   Leukocytes, UA NEGATIVE NEGATIVE    Imaging:  No results found.  MAU Course: Orders Placed This Encounter  Procedures  . Urinalysis, Routine w reflex microscopic (not at [facility])  . Prolonged fetal heart rate monitoring    Discussed history, exam, feel her increasing and return to normal fetal movement per patient with Dr. Marcelle Overlie. Patient okay for discharge. Also discussed exposure to child with hand-foot-and-mouth disease. No testing needed at this time.  MDM: - Decreased fetal movement, resolved. Rectal fetal heart rate tracing and active fetus now. - Braxton Hicks contractions without evidence of active preterm labor. - Hand-foot-and-mouth exposure. Patient isn't symptomatic. Recommend that she discuss this at her next prenatal visit.   Assessment: 1. Decreased fetal movement, third trimester, not applicable or unspecified fetus   2. Braxton Hicks contractions   3. Exposure to viral disease     Plan: Discharge home in stable condition.  Preterm labor precautions and fetal kick counts Notify her doctors office he experience fever, chills or rash as there are some viral illnesses which may be dangerous to the pregnancy. Follow-up Information    [facility] .   Why:  Call in the morning to reschedule your appointment and ask if they recommend any testing related to hand-foot-mouth exposure Contact information: [address] [place] [address] [phone]        [facility] [place] MATERNITY ADMISSIONS .   Why:  As needed in emergencies Contact information: [address] [id number] [address]  [phone]            Medication List    STOP taking these medications   ibuprofen 600 MG tablet Commonly known as:  ADVIL,MOTRIN     TAKE these medications   prenatal vitamin w/FE, FA 27-1 MG Tabs tablet Take 1 tablet by mouth every morning.   sertraline 50 MG tablet Commonly known as:  ZOLOFT Take 50 mg by mouth daily.       [place]l, CNM 06/12/2016 1:11 AM

## 2016-06-16 LAB — OB RESULTS CONSOLE GBS: STREP GROUP B AG: NEGATIVE

## 2016-06-26 ENCOUNTER — Inpatient Hospital Stay (HOSPITAL_COMMUNITY)
Admission: AD | Admit: 2016-06-26 | Discharge: 2016-06-26 | Disposition: A | Discharge status: Home / Self Care | Payer: Medicaid Other | Source: Ambulatory Visit | Attending: Obstetrics and Gynecology | Admitting: Obstetrics and Gynecology

## 2016-06-26 ENCOUNTER — Encounter (HOSPITAL_COMMUNITY): Payer: Self-pay | Admitting: *Deleted

## 2016-06-26 DIAGNOSIS — Z3A37 37 weeks gestation of pregnancy: Secondary | ICD-10-CM | POA: Diagnosis not present

## 2016-06-26 DIAGNOSIS — Z3493 Encounter for supervision of normal pregnancy, unspecified, third trimester: Secondary | ICD-10-CM | POA: Diagnosis not present

## 2016-06-26 NOTE — Discharge Instructions (Signed)
Braxton Hicks Contractions °Contractions of the uterus can occur throughout pregnancy. Contractions are not always a sign that you are in labor.  °WHAT ARE BRAXTON HICKS CONTRACTIONS?  °Contractions that occur before labor are called Braxton Hicks contractions, or false labor. Toward the end of pregnancy (32-34 weeks), these contractions can develop more often and may become more forceful. This is not true labor because these contractions do not result in opening (dilatation) and thinning of the cervix. They are sometimes difficult to tell apart from true labor because these contractions can be forceful and people have different pain tolerances. You should not feel embarrassed if you go to the hospital with false labor. Sometimes, the only way to tell if you are in true labor is for your health care provider to look for changes in the cervix. °If there are no prenatal problems or other health problems associated with the pregnancy, it is completely safe to be sent home with false labor and await the onset of true labor. °HOW CAN YOU TELL THE DIFFERENCE BETWEEN TRUE AND FALSE LABOR? °False Labor °· The contractions of false labor are usually shorter and not as hard as those of true labor.   °· The contractions are usually irregular.   °· The contractions are often felt in the front of the lower abdomen and in the groin.   °· The contractions may go away when you walk around or change positions while lying down.   °· The contractions get weaker and are shorter lasting as time goes on.   °· The contractions do not usually become progressively stronger, regular, and closer together as with true labor.   °True Labor °· Contractions in true labor last 30-70 seconds, become very regular, usually become more intense, and increase in frequency.   °· The contractions do not go away with walking.   °· The discomfort is usually felt in the top of the uterus and spreads to the lower abdomen and low back.   °· True labor can be  determined by your health care provider with an exam. This will show that the cervix is dilating and getting thinner.   °WHAT TO REMEMBER °· Keep up with your usual exercises and follow other instructions given by your health care provider.   °· Take medicines as directed by your health care provider.   °· Keep your regular prenatal appointments.   °· Eat and drink lightly if you think you are going into labor.   °· If Braxton Hicks contractions are making you uncomfortable:   °¨ Change your position from lying down or resting to walking, or from walking to resting.   °¨ Sit and rest in a tub of warm water.   °¨ Drink 2-3 glasses of water. Dehydration may cause these contractions.   °¨ Do slow and deep breathing several times an hour.   °WHEN SHOULD I SEEK IMMEDIATE MEDICAL CARE? °Seek immediate medical care if: °· Your contractions become stronger, more regular, and closer together.   °· You have fluid leaking or gushing from your vagina.   °· You have a fever.   °· You pass blood-tinged mucus.   °· You have vaginal bleeding.   °· You have continuous abdominal pain.   °· You have low back pain that you never had before.   °· You feel your baby's head pushing down and causing pelvic pressure.   °· Your baby is not moving as much as it used to.   °  °This information is not intended to replace advice given to you by your health care provider. Make sure you discuss any questions you have with your health care   provider. °  °Document Released: 09/08/2005 Document Revised: 09/13/2013 Document Reviewed: 06/20/2013 °Elsevier Interactive Patient Education ©2016 [employer] °Fetal Movement Counts °Patient Name: __________________________________________________ Patient Due Date: ____________________ °Performing a fetal movement count is highly recommended in high-risk pregnancies, but it is good for every pregnant woman to do. Your health care provider may ask you to start counting fetal movements at 28 weeks of the  pregnancy. Fetal movements often increase: °· After eating a full meal. °· After physical activity. °· After eating or drinking something sweet or cold. °· At rest. °Pay attention to when you feel the baby is most active. This will help you notice a pattern of your baby's sleep and wake cycles and what factors contribute to an increase in fetal movement. It is important to perform a fetal movement count at the same time each day when your baby is normally most active.  °HOW TO COUNT FETAL MOVEMENTS °1. Find a quiet and comfortable area to sit or lie down on your left side. Lying on your left side provides the best blood and oxygen circulation to your baby. °2. Write down the day and time on a sheet of paper or in a journal. °3. Start counting kicks, flutters, swishes, rolls, or jabs in a 2-hour period. You should feel at least 10 movements within 2 hours. °4. If you do not feel 10 movements in 2 hours, wait 2-3 hours and count again. Look for a change in the pattern or not enough counts in 2 hours. °SEEK MEDICAL CARE IF: °· You feel less than 10 counts in 2 hours, tried twice. °· There is no movement in over an hour. °· The pattern is changing or taking longer each day to reach 10 counts in 2 hours. °· You feel the baby is not moving as he or she usually does. °Date: ____________ Movements: ____________ Start time: ____________ Finish time: ____________  °Date: ____________ Movements: ____________ Start time: ____________ Finish time: ____________ °Date: ____________ Movements: ____________ Start time: ____________ Finish time: ____________ °Date: ____________ Movements: ____________ Start time: ____________ Finish time: ____________ °Date: ____________ Movements: ____________ Start time: ____________ Finish time: ____________ °Date: ____________ Movements: ____________ Start time: ____________ Finish time: ____________ °Date: ____________ Movements: ____________ Start time: ____________ Finish time:  ____________ °Date: ____________ Movements: ____________ Start time: ____________ Finish time: ____________  °Date: ____________ Movements: ____________ Start time: ____________ Finish time: ____________ °Date: ____________ Movements: ____________ Start time: ____________ Finish time: ____________ °Date: ____________ Movements: ____________ Start time: ____________ Finish time: ____________ °Date: ____________ Movements: ____________ Start time: ____________ Finish time: ____________ °Date: ____________ Movements: ____________ Start time: ____________ Finish time: ____________ °Date: ____________ Movements: ____________ Start time: ____________ Finish time: ____________ °Date: ____________ Movements: ____________ Start time: ____________ Finish time: ____________  °Date: ____________ Movements: ____________ Start time: ____________ Finish time: ____________ °Date: ____________ Movements: ____________ Start time: ____________ Finish time: ____________ °Date: ____________ Movements: ____________ Start time: ____________ Finish time: ____________ °Date: ____________ Movements: ____________ Start time: ____________ Finish time: ____________ °Date: ____________ Movements: ____________ Start time: ____________ Finish time: ____________ °Date: ____________ Movements: ____________ Start time: ____________ Finish time: ____________ °Date: ____________ Movements: ____________ Start time: ____________ Finish time: ____________  °Date: ____________ Movements: ____________ Start time: ____________ Finish time: ____________ °Date: ____________ Movements: ____________ Start time: ____________ Finish time: ____________ °Date: ____________ Movements: ____________ Start time: ____________ Finish time: ____________ °Date: ____________ Movements: ____________ Start time: ____________ Finish time: ____________ °Date: ____________ Movements: ____________ Start time: ____________ Finish time: ____________ °Date: ____________ Movements:  ____________ Start time: ____________ Finish time:   ____________ °Date: ____________ Movements: ____________ Start time: ____________ Finish time: ____________  °Date: ____________ Movements: ____________ Start time: ____________ Finish time: ____________ °Date: ____________ Movements: ____________ Start time: ____________ Finish time: ____________ °Date: ____________ Movements: ____________ Start time: ____________ Finish time: ____________ °Date: ____________ Movements: ____________ Start time: ____________ Finish time: ____________ °Date: ____________ Movements: ____________ Start time: ____________ Finish time: ____________ °Date: ____________ Movements: ____________ Start time: ____________ Finish time: ____________ °Date: ____________ Movements: ____________ Start time: ____________ Finish time: ____________  °Date: ____________ Movements: ____________ Start time: ____________ Finish time: ____________ °Date: ____________ Movements: ____________ Start time: ____________ Finish time: ____________ °Date: ____________ Movements: ____________ Start time: ____________ Finish time: ____________ °Date: ____________ Movements: ____________ Start time: ____________ Finish time: ____________ °Date: ____________ Movements: ____________ Start time: ____________ Finish time: ____________ °Date: ____________ Movements: ____________ Start time: ____________ Finish time: ____________ °Date: ____________ Movements: ____________ Start time: ____________ Finish time: ____________  °Date: ____________ Movements: ____________ Start time: ____________ Finish time: ____________ °Date: ____________ Movements: ____________ Start time: ____________ Finish time: ____________ °Date: ____________ Movements: ____________ Start time: ____________ Finish time: ____________ °Date: ____________ Movements: ____________ Start time: ____________ Finish time: ____________ °Date: ____________ Movements: ____________ Start time: ____________ Finish  time: ____________ °Date: ____________ Movements: ____________ Start time: ____________ Finish time: ____________ °Date: ____________ Movements: ____________ Start time: ____________ Finish time: ____________  °Date: ____________ Movements: ____________ Start time: ____________ Finish time: ____________ °Date: ____________ Movements: ____________ Start time: ____________ Finish time: ____________ °Date: ____________ Movements: ____________ Start time: ____________ Finish time: ____________ °Date: ____________ Movements: ____________ Start time: ____________ Finish time: ____________ °Date: ____________ Movements: ____________ Start time: ____________ Finish time: ____________ °Date: ____________ Movements: ____________ Start time: ____________ Finish time: ____________ °  °This information is not intended to replace advice given to you by your health care provider. Make sure you discuss any questions you have with your health care provider. °  °Document Released: 10/08/2006 Document Revised: 09/29/2014 Document Reviewed: 07/05/2012 °Elsevier Interactive Patient Education ©2016 Elsevier Inc. ° °

## 2016-06-26 NOTE — MAU Note (Signed)
Pt states she has been having more intense contractions for the last 1-1.5 hours. Unable to tell when they start and stop. Was 4 cm on appt on Wed.  Denies vaginal bleeding, ROM, or abnormal discharge. Pt states she has been passing more of her mucous plug.  Good fetal movement.

## 2016-07-01 ENCOUNTER — Encounter (HOSPITAL_COMMUNITY): Payer: Self-pay | Admitting: *Deleted

## 2016-07-01 ENCOUNTER — Telehealth (HOSPITAL_COMMUNITY): Payer: Self-pay | Admitting: *Deleted

## 2016-07-01 NOTE — Telephone Encounter (Signed)
Preadmission screen  

## 2016-07-07 ENCOUNTER — Encounter (HOSPITAL_COMMUNITY): Payer: Self-pay

## 2016-07-07 ENCOUNTER — Inpatient Hospital Stay (HOSPITAL_COMMUNITY)
Admission: RE | Admit: 2016-07-07 | Discharge: 2016-07-08 | DRG: 775 | Disposition: A | Discharge status: Home / Self Care | Payer: Medicaid Other | Source: Ambulatory Visit | Attending: Obstetrics and Gynecology | Admitting: Obstetrics and Gynecology

## 2016-07-07 ENCOUNTER — Inpatient Hospital Stay (HOSPITAL_COMMUNITY): Payer: Medicaid Other | Admitting: Anesthesiology

## 2016-07-07 DIAGNOSIS — Z3490 Encounter for supervision of normal pregnancy, unspecified, unspecified trimester: Secondary | ICD-10-CM

## 2016-07-07 DIAGNOSIS — Z3A39 39 weeks gestation of pregnancy: Secondary | ICD-10-CM | POA: Diagnosis not present

## 2016-07-07 DIAGNOSIS — Z833 Family history of diabetes mellitus: Secondary | ICD-10-CM

## 2016-07-07 DIAGNOSIS — I7300 Raynaud's syndrome without gangrene: Secondary | ICD-10-CM | POA: Diagnosis present

## 2016-07-07 DIAGNOSIS — Z8249 Family history of ischemic heart disease and other diseases of the circulatory system: Secondary | ICD-10-CM

## 2016-07-07 DIAGNOSIS — O26893 Other specified pregnancy related conditions, third trimester: Secondary | ICD-10-CM | POA: Diagnosis present

## 2016-07-07 HISTORY — DX: Gestational (pregnancy-induced) hypertension without significant proteinuria, unspecified trimester: O13.9

## 2016-07-07 LAB — CBC
HEMATOCRIT: 35 % — AB (ref 36.0–46.0)
Hemoglobin: 11.9 g/dL — ABNORMAL LOW (ref 12.0–15.0)
MCH: 29.7 pg (ref 26.0–34.0)
MCHC: 34 g/dL (ref 30.0–36.0)
MCV: 87.3 fL (ref 78.0–100.0)
PLATELETS: 228 10*3/uL (ref 150–400)
RBC: 4.01 MIL/uL (ref 3.87–5.11)
RDW: 13.9 % (ref 11.5–15.5)
WBC: 9.7 10*3/uL (ref 4.0–10.5)

## 2016-07-07 LAB — TYPE AND SCREEN
ABO/RH(D): AB POS
ANTIBODY SCREEN: NEGATIVE

## 2016-07-07 LAB — RPR: RPR: NONREACTIVE

## 2016-07-07 MED ORDER — SOD CITRATE-CITRIC ACID 500-334 MG/5ML PO SOLN
30.0000 mL | ORAL | Status: DC | PRN
Start: 2016-07-07 — End: 2016-07-07

## 2016-07-07 MED ORDER — PHENYLEPHRINE 40 MCG/ML (10ML) SYRINGE FOR IV PUSH (FOR BLOOD PRESSURE SUPPORT)
80.0000 ug | PREFILLED_SYRINGE | INTRAVENOUS | Status: DC | PRN
  Filled 2016-07-07: qty 5

## 2016-07-07 MED ORDER — PHENYLEPHRINE 40 MCG/ML (10ML) SYRINGE FOR IV PUSH (FOR BLOOD PRESSURE SUPPORT): 80.0000 ug | PREFILLED_SYRINGE | INTRAVENOUS | Status: DC | PRN

## 2016-07-07 MED ORDER — BUTORPHANOL TARTRATE 1 MG/ML IJ SOLN: 1.0000 mg | INTRAMUSCULAR | Status: DC | PRN

## 2016-07-07 MED ORDER — DIPHENHYDRAMINE HCL 50 MG/ML IJ SOLN: 12.5000 mg | INTRAMUSCULAR | Status: DC | PRN

## 2016-07-07 MED ORDER — LACTATED RINGERS IV SOLN: INTRAVENOUS | Status: DC

## 2016-07-07 MED ORDER — EPHEDRINE 5 MG/ML INJ: 10.0000 mg | INTRAVENOUS | Status: DC | PRN

## 2016-07-07 MED ORDER — ACETAMINOPHEN 325 MG PO TABS: 650.0000 mg | ORAL_TABLET | ORAL | Status: DC | PRN

## 2016-07-07 MED ORDER — LIDOCAINE HCL (PF) 1 % IJ SOLN
30.0000 mL | INTRAMUSCULAR | Status: DC | PRN
  Filled 2016-07-07: qty 30

## 2016-07-07 MED ORDER — FENTANYL 2.5 MCG/ML BUPIVACAINE 1/10 % EPIDURAL INFUSION (WH - ANES)
14.0000 mL/h | INTRAMUSCULAR | Status: DC | PRN
  Administered 2016-07-07 (×2): 14 mL/h via EPIDURAL

## 2016-07-07 MED ORDER — OXYTOCIN 40 UNITS IN LACTATED RINGERS INFUSION - SIMPLE MED
2.5000 [IU]/h | INTRAVENOUS | Status: DC
  Administered 2016-07-07: 2.5 [IU]/h via INTRAVENOUS

## 2016-07-07 MED ORDER — FLEET ENEMA 7-19 GM/118ML RE ENEM
1.0000 | ENEMA | RECTAL | Status: DC | PRN
Start: 2016-07-07 — End: 2016-07-07

## 2016-07-07 MED ORDER — ONDANSETRON HCL 4 MG/2ML IJ SOLN: 4.0000 mg | INTRAMUSCULAR | Status: DC | PRN

## 2016-07-07 MED ORDER — LACTATED RINGERS IV SOLN
500.0000 mL | INTRAVENOUS | Status: DC | PRN
  Administered 2016-07-07: 500 mL via INTRAVENOUS

## 2016-07-07 MED ORDER — OXYCODONE-ACETAMINOPHEN 5-325 MG PO TABS: 2.0000 | ORAL_TABLET | ORAL | Status: DC | PRN

## 2016-07-07 MED ORDER — TERBUTALINE SULFATE 1 MG/ML IJ SOLN
0.2500 mg | Freq: Once | INTRAMUSCULAR | Status: DC | PRN
  Filled 2016-07-07: qty 1

## 2016-07-07 MED ORDER — COCONUT OIL OIL
1.0000 "application " | TOPICAL_OIL | Status: DC | PRN
  Administered 2016-07-08: 1 via TOPICAL
  Filled 2016-07-07: qty 120

## 2016-07-07 MED ORDER — FLEET ENEMA 7-19 GM/118ML RE ENEM: 1.0000 | ENEMA | RECTAL | Status: DC | PRN

## 2016-07-07 MED ORDER — WITCH HAZEL-GLYCERIN EX PADS: 1.0000 "application " | MEDICATED_PAD | CUTANEOUS | Status: DC | PRN

## 2016-07-07 MED ORDER — DIBUCAINE 1 % RE OINT: 1.0000 "application " | TOPICAL_OINTMENT | RECTAL | Status: DC | PRN

## 2016-07-07 MED ORDER — OXYCODONE HCL 5 MG PO TABS: 10.0000 mg | ORAL_TABLET | ORAL | Status: DC | PRN

## 2016-07-07 MED ORDER — ONDANSETRON HCL 4 MG PO TABS: 4.0000 mg | ORAL_TABLET | ORAL | Status: DC | PRN

## 2016-07-07 MED ORDER — BENZOCAINE-MENTHOL 20-0.5 % EX AERO
1.0000 "application " | INHALATION_SPRAY | CUTANEOUS | Status: DC | PRN
  Administered 2016-07-07: 1 via TOPICAL
  Filled 2016-07-07: qty 56

## 2016-07-07 MED ORDER — LACTATED RINGERS IV SOLN: 500.0000 mL | Freq: Once | INTRAVENOUS | Status: DC

## 2016-07-07 MED ORDER — OXYTOCIN BOLUS FROM INFUSION
500.0000 mL | Freq: Once | INTRAVENOUS | Status: AC
  Administered 2016-07-07: 500 mL via INTRAVENOUS

## 2016-07-07 MED ORDER — LIDOCAINE HCL (PF) 1 % IJ SOLN: 30.0000 mL | INTRAMUSCULAR | Status: DC | PRN

## 2016-07-07 MED ORDER — OXYCODONE HCL 5 MG PO TABS: 5.0000 mg | ORAL_TABLET | ORAL | Status: DC | PRN

## 2016-07-07 MED ORDER — TETANUS-DIPHTH-ACELL PERTUSSIS 5-2.5-18.5 LF-MCG/0.5 IM SUSP: 0.5000 mL | Freq: Once | INTRAMUSCULAR | Status: DC

## 2016-07-07 MED ORDER — ONDANSETRON HCL 4 MG/2ML IJ SOLN: 4.0000 mg | Freq: Four times a day (QID) | INTRAMUSCULAR | Status: DC | PRN

## 2016-07-07 MED ORDER — EPHEDRINE 5 MG/ML INJ
10.0000 mg | INTRAVENOUS | Status: DC | PRN
  Filled 2016-07-07: qty 4

## 2016-07-07 MED ORDER — SERTRALINE HCL 50 MG PO TABS
50.0000 mg | ORAL_TABLET | Freq: Every day | ORAL | Status: DC
  Administered 2016-07-08: 50 mg via ORAL
  Filled 2016-07-07 (×2): qty 1

## 2016-07-07 MED ORDER — SOD CITRATE-CITRIC ACID 500-334 MG/5ML PO SOLN: 30.0000 mL | ORAL | Status: DC | PRN

## 2016-07-07 MED ORDER — SENNOSIDES-DOCUSATE SODIUM 8.6-50 MG PO TABS: 2.0000 | ORAL_TABLET | ORAL | Status: DC

## 2016-07-07 MED ORDER — OXYTOCIN 40 UNITS IN LACTATED RINGERS INFUSION - SIMPLE MED
1.0000 m[IU]/min | INTRAVENOUS | Status: DC
  Administered 2016-07-07: 4 m[IU]/min via INTRAVENOUS
  Administered 2016-07-07: 8 m[IU]/min via INTRAVENOUS
  Administered 2016-07-07: 2 m[IU]/min via INTRAVENOUS
  Administered 2016-07-07: 6 m[IU]/min via INTRAVENOUS
  Filled 2016-07-07: qty 1000

## 2016-07-07 MED ORDER — PRENATAL MULTIVITAMIN CH
1.0000 | ORAL_TABLET | Freq: Every day | ORAL | Status: DC
  Administered 2016-07-08: 1 via ORAL
  Filled 2016-07-07: qty 1

## 2016-07-07 MED ORDER — OXYTOCIN BOLUS FROM INFUSION: 500.0000 mL | Freq: Once | INTRAVENOUS | Status: DC

## 2016-07-07 MED ORDER — ZOLPIDEM TARTRATE 5 MG PO TABS: 5.0000 mg | ORAL_TABLET | Freq: Every evening | ORAL | Status: DC | PRN

## 2016-07-07 MED ORDER — LIDOCAINE HCL (PF) 1 % IJ SOLN
INTRAMUSCULAR | Status: DC | PRN
  Administered 2016-07-07: 6 mL via EPIDURAL
  Administered 2016-07-07: 4 mL via EPIDURAL

## 2016-07-07 MED ORDER — LACTATED RINGERS IV SOLN
INTRAVENOUS | Status: DC
  Administered 2016-07-07: 1000 mL via INTRAVENOUS
  Administered 2016-07-07: 14:00:00 via INTRAVENOUS

## 2016-07-07 MED ORDER — IBUPROFEN 600 MG PO TABS
600.0000 mg | ORAL_TABLET | Freq: Four times a day (QID) | ORAL | Status: DC
  Administered 2016-07-07 – 2016-07-08 (×4): 600 mg via ORAL
  Filled 2016-07-07 (×4): qty 1

## 2016-07-07 MED ORDER — FENTANYL 2.5 MCG/ML BUPIVACAINE 1/10 % EPIDURAL INFUSION (WH - ANES)
INTRAMUSCULAR | Status: AC
  Filled 2016-07-07: qty 125

## 2016-07-07 MED ORDER — LACTATED RINGERS IV SOLN: 500.0000 mL | INTRAVENOUS | Status: DC | PRN

## 2016-07-07 MED ORDER — OXYCODONE-ACETAMINOPHEN 5-325 MG PO TABS: 1.0000 | ORAL_TABLET | ORAL | Status: DC | PRN

## 2016-07-07 MED ORDER — OXYTOCIN 40 UNITS IN LACTATED RINGERS INFUSION - SIMPLE MED: 2.5000 [IU]/h | INTRAVENOUS | Status: DC

## 2016-07-07 MED ORDER — SIMETHICONE 80 MG PO CHEW: 80.0000 mg | CHEWABLE_TABLET | ORAL | Status: DC | PRN

## 2016-07-07 MED ORDER — DIPHENHYDRAMINE HCL 25 MG PO CAPS
25.0000 mg | ORAL_CAPSULE | Freq: Four times a day (QID) | ORAL | Status: DC | PRN
  Administered 2016-07-07: 25 mg via ORAL
  Filled 2016-07-07: qty 1

## 2016-07-07 MED ORDER — PHENYLEPHRINE 40 MCG/ML (10ML) SYRINGE FOR IV PUSH (FOR BLOOD PRESSURE SUPPORT)
PREFILLED_SYRINGE | INTRAVENOUS | Status: AC
  Filled 2016-07-07: qty 20

## 2016-07-07 NOTE — Anesthesia Pain Management Evaluation Note (Signed)
  [facility] Pain Management Visit Note  Patient: Tracy GordonHannah M Byrd, 35 y.o., female  "Hello I am a [facility]. We have an anesthesia team available at all times to provide care throughout the hospital, including epidural management and anesthesia for C-section. I don't know your plan for the delivery whether it a natural birth, water birth, IV sedation, nitrous supplementation, doula or epidural, but we want to meet your pain goals."   1.Was your pain managed to your expectations on prior hospitalizations?   Yes   2.What is your expectation for pain management during this hospitalization?     Epidural  3.How can we help you reach that goal?   Record the patient's initial score and the patient's pain goal.   Pain: 0  Pain Goal: 6 [facility] wants you to be able to say your pain was always managed very well.  Laban EmperorMalinova,Augustino Savastano Hristova 07/07/2016

## 2016-07-07 NOTE — H&P (Signed)
Tracy GordonHannah M Tran is a 35 y.o. female presenting for IOL at term with favorable cervix and history of rapid labor. OB History    Gravida Para Term Preterm AB Living   4 2 2   1 2    SAB TAB Ectopic Multiple Live Births         0 2     Past Medical History:  Diagnosis Date  . Anxiety   . Pregnancy induced hypertension   . Raynaud disease   . Vaginal Pap smear, abnormal    Past Surgical History:  Procedure Laterality Date  . DILATION AND CURETTAGE OF UTERUS     Family History: family history includes Diabetes in her maternal grandfather; Heart disease in her father and mother; Hypertension in her father and mother; Thyroid disease in her mother. Social History:  reports that she has never smoked. She has never used smokeless tobacco. She reports that she does not drink alcohol or use drugs.     Maternal Diabetes: No Genetic Screening: Normal Maternal Ultrasounds/Referrals: Normal Fetal Ultrasounds or other Referrals:  None Maternal Substance Abuse:  No Significant Maternal Medications:  None Significant Maternal Lab Results:  None Other Comments:  None  Review of Systems  Eyes: Negative for blurred vision.  Gastrointestinal: Negative for abdominal pain.  Neurological: Negative for headaches.   Maternal Medical History:  Fetal activity: Perceived fetal activity is normal.        Blood pressure (!) 129/92, pulse (!) 111, temperature 98.3 F (36.8 C), temperature source Oral, resp. rate 18, height 5\' 7"  (1.702 m), weight 197 lb (89.4 kg), unknown if currently breastfeeding. Maternal Exam:  Uterine Assessment: Contraction strength is mild.  Contraction frequency is irregular.   Abdomen: Patient reports no abdominal tenderness. Fetal presentation: vertex     Fetal Exam Fetal State Assessment: Category I - tracings are normal.     Physical Exam  Cardiovascular: Normal rate and regular rhythm.   Respiratory: Effort normal and breath sounds normal.  GI: Soft. There is  no tenderness.  Neurological: She has normal reflexes.    Cx 3-4/50/vtx/soft AROM trickle of clear fluid  Prenatal labs: ABO, Rh: AB/Positive/-- (03/21 0000) Antibody: Negative (03/21 0000) Rubella: Immune (03/21 0000) RPR: Nonreactive (03/21 0000)  HBsAg: Negative (03/21 0000)  HIV: Non-reactive (03/21 0000)  GBS:     Assessment/Plan: 35 yo G4P2 at term Anticipate vaginal delivery All questions answered Patient states she understands and agrees   Cedar RidgeOMBLIN II,Milda Lindvall E 07/07/2016, 7:49 AM

## 2016-07-07 NOTE — Anesthesia Procedure Notes (Signed)
Epidural Patient location during procedure: OB Start time: 07/07/2016 5:28 PM End time: 07/07/2016 5:33 PM  Staffing Anesthesiologist: Linton RumpALLAN, Alroy Portela DICKERSON Performed: anesthesiologist   Preanesthetic Checklist Completed: patient identified, surgical consent, pre-op evaluation, timeout performed, IV checked, risks and benefits discussed and monitors and equipment checked  Epidural Patient position: sitting Prep: site prepped and draped and DuraPrep Patient monitoring: continuous pulse ox and blood pressure Approach: midline Location: L2-L3 Injection technique: LOR air  Needle:  Needle type: Tuohy  Needle gauge: 17 G Needle length: 9 cm and 9 Needle insertion depth: 4 cm Catheter type: closed end flexible Catheter size: 19 Gauge Catheter at skin depth: 9 cm Test dose: negative  Assessment Events: blood not aspirated, injection not painful, no injection resistance, negative IV test and no paresthesia  Additional Notes Reason for block:procedure for pain

## 2016-07-07 NOTE — Progress Notes (Signed)
Delivery Note At 7:09 PM a viable female was delivered via  (Presentation: ;  ).  APGAR: , ; weight  .   Placenta status: intact, .  Cord: 3 vessels with the following complications:nuchal cord x 2 .  Cord pH: not done  Anesthesia:  epidural Episiotomy:  none Lacerations:  Small second degree laceration @ 6:00 repaired Suture Repair: 2.0 vicryl rapide Est. Blood Loss (mL):  150  Mom to postpartum.  Baby to Couplet care / Skin to Skin.  Tracy Tran,Tracy Tran E 07/07/2016, 7:18 PM

## 2016-07-07 NOTE — Anesthesia Preprocedure Evaluation (Signed)
Anesthesia Evaluation  Patient identified by MRN, date of birth, ID band Patient awake    Reviewed: Allergy & Precautions, NPO status , Patient's Chart, lab work & pertinent test results  History of Anesthesia Complications Negative for: history of anesthetic complications  Airway Mallampati: III  TM Distance: >3 FB Neck ROM: Full    Dental  (+) Teeth Intact   Pulmonary neg pulmonary ROS,    Pulmonary exam normal breath sounds clear to auscultation       Cardiovascular hypertension (gestational),  Rhythm:Regular Rate:Normal  HLD   Neuro/Psych PSYCHIATRIC DISORDERS Anxiety negative neurological ROS     GI/Hepatic negative GI ROS, Neg liver ROS,   Endo/Other  negative endocrine ROS  Renal/GU negative Renal ROS     Musculoskeletal   Abdominal   Peds  Hematology negative hematology ROS (+)   Anesthesia Other Findings Raynaud disease  Reproductive/Obstetrics (+) Pregnancy                             Anesthesia Physical Anesthesia Plan  ASA: II  Anesthesia Plan: Epidural   Post-op Pain Management:    Induction:   Airway Management Planned: Natural Airway  Additional Equipment:   Intra-op Plan:   Post-operative Plan:   Informed Consent: I have reviewed the patients History and Physical, chart, labs and discussed the procedure including the risks, benefits and alternatives for the proposed anesthesia with the patient or authorized representative who has indicated his/her understanding and acceptance.     Plan Discussed with:   Anesthesia Plan Comments: (I have discussed risks of neuraxial anesthesia including but not limited to infection, bleeding, nerve injury, back pain, headache, seizures, and failure of block. Patient denies bleeding disorders and is not currently anticoagulated. Labs have been reviewed. Risks and benefits discussed. All patient's questions answered.    Platelets 228)        Anesthesia Quick Evaluation

## 2016-07-07 NOTE — Anesthesia Postprocedure Evaluation (Signed)
Anesthesia Post Note  Patient: Tracy GordonHannah M Byrd  Procedure(s) Performed: * No procedures listed *  Patient location during evaluation: Mother Baby Anesthesia Type: Epidural Level of consciousness: awake and alert Pain management: satisfactory to patient Vital Signs Assessment: post-procedure vital signs reviewed and stable Respiratory status: respiratory function stable Cardiovascular status: stable Postop Assessment: no headache, no backache, epidural receding, patient able to bend at knees, no signs of nausea or vomiting and adequate PO intake Anesthetic complications: no     Last Vitals:  Vitals:   07/07/16 2043 07/07/16 2100  BP:  124/65  Pulse:  62  Resp: 18 18  Temp:  36.5 C    Last Pain:  Vitals:   07/07/16 2100  TempSrc: Axillary  PainSc: 0-No pain   Pain Goal: Patients Stated Pain Goal: 8 (07/07/16 0717)               Karleen DolphinFUSSELL,Keatyn Luck

## 2016-07-08 ENCOUNTER — Ambulatory Visit: Payer: Self-pay

## 2016-07-08 NOTE — Discharge Summary (Signed)
Obstetric Discharge Summary Reason for Admission: induction of labor Prenatal Procedures: ultrasound Intrapartum Procedures: spontaneous vaginal delivery Postpartum Procedures: none Complications-Operative and Postpartum: 1 degree perineal laceration Hemoglobin  Date Value Ref Range Status  07/07/2016 11.9 (L) 12.0 - 15.0 Tran/dL Final   HCT  Date Value Ref Range Status  07/07/2016 35.0 (L) 36.0 - 46.0 % Final    Physical Exam:  General: alert and cooperative Lochia: appropriate Uterine Fundus: firm Incision: healing well DVT Evaluation: No evidence of DVT seen on physical exam. Negative Homan's sign. No cords or calf tenderness. No significant calf/ankle edema.  Discharge Diagnoses: Term Pregnancy-delivered  Discharge Information: Date: 07/08/2016 Activity: pelvic rest Diet: routine Medications: PNV and Ibuprofen Condition: stable Instructions: refer to practice specific booklet Discharge to: home   Newborn Data: Live born female  Birth Weight: 6 lb 12.5 oz (3075 Tran) APGAR: 9, 9  Home with mother.  Tracy Tran 07/08/2016, 8:58 AM

## 2016-07-08 NOTE — Lactation Note (Signed)
This note was copied from a baby's chart. Lactation Consultation Note  Patient Name: Tracy Tran KrasHannah Byrd ZOXWR'UToday's Date: 07/08/2016 Reason for consult: Initial assessment Baby at 23 hr of life. Mom is reporting bilateral sore nipples. Both nipples appear bruised and had compression stripes, given comfort gels. Baby has a high palate and lingual frenulum was felt. When baby sucks on a gloved finger he does a lot of biting down and is very unsettled. Sucking did improve with some suck training. When doing syring feeding, he settles and has perfect tongue movement/placement. Applied #20 NS and prefilled the NS with formula. Baby latched easily and mom reported it felt like he was sucking, not biting. When baby became unsettle and started biting, more formula was placed int he NS, baby went back to pain free suckling. Discussed baby behavior, feeding frequency, baby belly size, voids, wt loss, breast changes, and nipple care. Mom can manually express and has Harmony in the room. Suggested that as her supply grows she should manually express into the NS to get baby going. As her milk transitions she can pre pump then latch without the NS. Given lactation handouts. Aware of OP services and support group.   Maternal Data Has patient been taught Hand Expression?: Yes Does the patient have breastfeeding experience prior to this delivery?: Yes  Feeding Feeding Type: Breast Fed Length of feed: 20 min  LATCH Score/Interventions Latch: Grasps breast easily, tongue down, lips flanged, rhythmical sucking. Intervention(s): Adjust position;Assist with latch;Breast massage;Breast compression  Audible Swallowing: Spontaneous and intermittent Intervention(s): Hand expression;Skin to skin  Type of Nipple: Everted at rest and after stimulation  Comfort (Breast/Nipple): Filling, red/small blisters or bruises, mild/mod discomfort  Problem noted: Mild/Moderate discomfort;Cracked, bleeding, blisters,  bruises Interventions  (Cracked/bleeding/bruising/blister): Expressed breast milk to nipple Interventions (Mild/moderate discomfort): Comfort gels  Hold (Positioning): Assistance needed to correctly position infant at breast and maintain latch. Intervention(s): Support Pillows;Position options  LATCH Score: 8  Lactation Tools Discussed/Used Tools: Nipple Shields Nipple shield size: 20 WIC Program: No   Consult Status Consult Status: Follow-up Date: 07/09/16 Follow-up type: In-patient    Rulon Eisenmengerlizabeth E Rhyleigh Grassel 07/08/2016, 7:04 PM

## 2016-07-08 NOTE — Plan of Care (Signed)
Problem: Education: Goal: Knowledge of condition will improve Reviewed discharge education with patient and significant other. Patient verbalizes understanding of education.

## 2016-07-08 NOTE — Progress Notes (Signed)
MOB was referred for history of depression/anxiety. * Referral screened out by Clinical Social Worker because none of the following criteria appear to apply: ~ History of anxiety/depression during this pregnancy, or of post-partum depression. ~ Diagnosis of anxiety and/or depression within last 3 years OR * MOB's symptoms currently being treated with medication and/or therapy. Please contact the Clinical Social Worker if needs arise, or if MOB requests.  Farhana Fellows Boyd-Gilyard, MSW, LCSW Clinical Social Work [phone] 

## 2016-07-08 NOTE — Progress Notes (Signed)
Post Partum Day 1 Subjective: no complaints, up ad lib, voiding, tolerating PO, + flatus and desires late discharge this evening  Objective: Blood pressure (!) 114/59, pulse 62, temperature 98.6 F (37 C), temperature source Oral, resp. rate 18, height 5\' 7"  (1.702 m), weight 197 lb (89.4 kg), SpO2 100 %, unknown if currently breastfeeding.  Physical Exam:  General: alert and cooperative Lochia: appropriate Uterine Fundus: firm Incision: healing well DVT Evaluation: No evidence of DVT seen on physical exam. Negative Homan's sign. No cords or calf tenderness. No significant calf/ankle edema.   Recent Labs  07/07/16 0732  HGB 11.9*  HCT 35.0*    Assessment/Plan: Discharge home and Breastfeeding   LOS: 1 day   Emiline Mancebo G 07/08/2016, 8:51 AM

## 2016-07-09 ENCOUNTER — Ambulatory Visit: Payer: Self-pay

## 2016-07-09 NOTE — Lactation Note (Signed)
This note was copied from a baby's chart. Lactation Consultation Note  Baby 3338 hours old. Mother has abrasion on L tip and crack on R nipple. Provided mother with information about APNO and suggest she call her OB/GYN. Mother is using #20NS due to soreness but is not post pumping. She is supplementing with formula.  Encouraged her to start supplementing with her own breastmilk to help establish her milk supply. Recommend mother post pump 4-5 times a day for 10-15 min. Give baby back volume pumped.   Provided her with a #24NS for future use. Mother states she has Medela DEBP at home. Mother concerned about infant's frenulum possibly causing soreness. Provided resource sheet and and recommend discussing with her Pediatrician. Reviewed engorgement care and monitoring voids/stools.   Patient Name: Boy Rex KrasHannah Tran [id number]'s Date: 07/09/2016 Reason for consult: Follow-up assessment   Maternal Data    Feeding Feeding Type: Bottle Fed - Formula Length of feed: 5 min  LATCH Score/Interventions Latch: Grasps breast easily, tongue down, lips flanged, rhythmical sucking.  Audible Swallowing: A few with stimulation  Type of Nipple: Everted at rest and after stimulation  Comfort (Breast/Nipple): Filling, red/small blisters or bruises, mild/mod discomfort  Problem noted: Mild/Moderate discomfort;Cracked, bleeding, blisters, bruises Interventions  (Cracked/bleeding/bruising/blister): Expressed breast milk to nipple Interventions (Mild/moderate discomfort): Comfort gels  Hold (Positioning): No assistance needed to correctly position infant at breast.  LATCH Score: 8  Lactation Tools Discussed/Used     Consult Status Consult Status: Complete    Hardie PulleyBerkelhammer, Tracy Tran 07/09/2016, 9:19 AM

## 2018-03-02 ENCOUNTER — Emergency Department (HOSPITAL_COMMUNITY): Payer: Medicaid Other

## 2018-03-02 ENCOUNTER — Emergency Department (HOSPITAL_COMMUNITY)
Admission: EM | Admit: 2018-03-02 | Discharge: 2018-03-02 | Disposition: A | Discharge status: Home / Self Care | Payer: Medicaid Other | Attending: Emergency Medicine | Admitting: Emergency Medicine

## 2018-03-02 ENCOUNTER — Other Ambulatory Visit: Payer: Self-pay

## 2018-03-02 ENCOUNTER — Encounter (HOSPITAL_COMMUNITY): Payer: Self-pay | Admitting: *Deleted

## 2018-03-02 DIAGNOSIS — R531 Weakness: Secondary | ICD-10-CM | POA: Diagnosis not present

## 2018-03-02 DIAGNOSIS — G43109 Migraine with aura, not intractable, without status migrainosus: Secondary | ICD-10-CM | POA: Diagnosis not present

## 2018-03-02 DIAGNOSIS — R479 Unspecified speech disturbances: Secondary | ICD-10-CM | POA: Diagnosis present

## 2018-03-02 LAB — I-STAT CHEM 8, ED
BUN: 12 mg/dL (ref 6–20)
CALCIUM ION: 1.12 mmol/L — AB (ref 1.15–1.40)
Chloride: 103 mmol/L (ref 101–111)
Creatinine, Ser: 0.8 mg/dL (ref 0.44–1.00)
GLUCOSE: 100 mg/dL — AB (ref 65–99)
HCT: 44 % (ref 36.0–46.0)
HEMOGLOBIN: 15 g/dL (ref 12.0–15.0)
POTASSIUM: 3.6 mmol/L (ref 3.5–5.1)
SODIUM: 138 mmol/L (ref 135–145)
TCO2: 21 mmol/L — ABNORMAL LOW (ref 22–32)

## 2018-03-02 LAB — COMPREHENSIVE METABOLIC PANEL
ALK PHOS: 57 U/L (ref 38–126)
ALT: 21 U/L (ref 14–54)
ANION GAP: 13 (ref 5–15)
AST: 27 U/L (ref 15–41)
Albumin: 4.3 g/dL (ref 3.5–5.0)
BILIRUBIN TOTAL: 0.6 mg/dL (ref 0.3–1.2)
BUN: 11 mg/dL (ref 6–20)
CALCIUM: 9.4 mg/dL (ref 8.9–10.3)
CO2: 23 mmol/L (ref 22–32)
Chloride: 103 mmol/L (ref 101–111)
Creatinine, Ser: 0.89 mg/dL (ref 0.44–1.00)
GFR calc non Af Amer: >60 mL/min (ref >60)
Glucose, Bld: 98 mg/dL (ref 65–99)
Potassium: 3.6 mmol/L (ref 3.5–5.1)
Sodium: 139 mmol/L (ref 135–145)
TOTAL PROTEIN: 7 g/dL (ref 6.5–8.1)

## 2018-03-02 LAB — DIFFERENTIAL
Abs Immature Granulocytes: 0 10*3/uL (ref 0.0–0.1)
Basophils Absolute: 0 10*3/uL (ref 0.0–0.1)
Basophils Relative: 1 %
EOS PCT: 1 %
Eosinophils Absolute: 0.1 10*3/uL (ref 0.0–0.7)
Immature Granulocytes: 0 %
LYMPHS ABS: 2.2 10*3/uL (ref 0.7–4.0)
LYMPHS PCT: 37 %
MONO ABS: 0.4 10*3/uL (ref 0.1–1.0)
Monocytes Relative: 7 %
Neutro Abs: 3.3 10*3/uL (ref 1.7–7.7)
Neutrophils Relative %: 54 %

## 2018-03-02 LAB — CBC
HEMATOCRIT: 44.3 % (ref 36.0–46.0)
Hemoglobin: 14.5 g/dL (ref 12.0–15.0)
MCH: 29.4 pg (ref 26.0–34.0)
MCHC: 32.7 g/dL (ref 30.0–36.0)
MCV: 89.7 fL (ref 78.0–100.0)
PLATELETS: 247 10*3/uL (ref 150–400)
RBC: 4.94 MIL/uL (ref 3.87–5.11)
RDW: 12.1 % (ref 11.5–15.5)
WBC: 6.1 10*3/uL (ref 4.0–10.5)

## 2018-03-02 LAB — APTT: APTT: 26 s (ref 24–36)

## 2018-03-02 LAB — ETHANOL: Alcohol, Ethyl (B): <10 mg/dL (ref <10)

## 2018-03-02 LAB — PROTIME-INR
INR: 0.93
PROTHROMBIN TIME: 12.3 s (ref 11.4–15.2)

## 2018-03-02 LAB — I-STAT BETA HCG BLOOD, ED (MC, WL, AP ONLY)

## 2018-03-02 LAB — I-STAT TROPONIN, ED: Troponin i, poc: 0 ng/mL (ref 0.00–0.08)

## 2018-03-02 MED ORDER — ACETAMINOPHEN 325 MG PO TABS
650.0000 mg | ORAL_TABLET | Freq: Once | ORAL | Status: AC
  Administered 2018-03-02: 650 mg via ORAL
  Filled 2018-03-02: qty 2

## 2018-03-02 MED ORDER — IOPAMIDOL (ISOVUE-370) INJECTION 76%
INTRAVENOUS | Status: AC
  Filled 2018-03-02: qty 100

## 2018-03-02 MED ORDER — IOPAMIDOL (ISOVUE-370) INJECTION 76%
100.0000 mL | Freq: Once | INTRAVENOUS | Status: AC | PRN
  Administered 2018-03-02: 100 mL via INTRAVENOUS

## 2018-03-02 NOTE — Code Documentation (Signed)
37 yo Female coming from home where she had a sudden onset of a headache around 1000 while breastfeeding her son. Pt went upstairs to lie down on the bed when she said she started to feel worse. She reported being unable to comprehend what she was reading. Pt texted her husband to come check on her when he reported some right arm weakness, confusion, and right facial droop. Family member called EMS and EMS activated a Code Stroke. EMS reported no weakness upon their assessment. Stroke Team met patient upon arrival to the ED. Initial NIHSS 2 due to mild aphasia with some stuttering and Dysarthria. Pt denied any hx of complex migraines or being on birth control. Pt taken to CT. CT/CTA/CTP Negative. Stat MRI completed. No tPA due to low NIH and inconsistent exam with patient. Handoff given to [employer], Therapist, sports.

## 2018-03-02 NOTE — ED Provider Notes (Signed)
[facility] EMERGENCY DEPARTMENT Provider Note   CSN: [id number] Arrival date & time: 03/02/18  1139     History   Chief Complaint No chief complaint on file.   HPI Tracy Tran is a 37 y.o. female.  37 year old female prior history of anxiety here with acute onset around 10 AM of difficulty speaking and weakness of her right upper extremity.  Per EMS she was at home doing some cleaning when she texted her husband that she did not feel well and EMS found her with his difficulty speaking.  No obvious weakness in the extremity was appreciated.  She denies any blurry vision double vision chest pain shortness of breath.  There is no reported history of any trauma.  She was a code stroke activation on arrival here.  The history is provided by the patient and the EMS personnel.  Cerebrovascular Accident  This is a new problem. The current episode started 1 to 2 hours ago. The problem occurs constantly. The problem has not changed since onset.Associated symptoms include headaches. Pertinent negatives include no chest pain, no abdominal pain and no shortness of breath. Nothing aggravates the symptoms. Nothing relieves the symptoms. She has tried nothing for the symptoms. The treatment provided no relief.    Past Medical History:  Diagnosis Date  . Anxiety   . Pregnancy induced hypertension    with previous pregnancy  . Raynaud disease   . Vaginal Pap smear, abnormal     Patient Active Problem List   Diagnosis Date Noted  . Pregnancy 07/07/2016  . Term pregnancy 07/07/2016  . IUGR (intrauterine growth restriction) 10/01/2014  . DYSLIPIDEMIA 05/10/2009  . TOBACCO ABUSE 05/10/2009    Past Surgical History:  Procedure Laterality Date  . DILATION AND CURETTAGE OF UTERUS       OB History    Gravida  4   Para  3   Term  3   Preterm      AB  1   Living  3     SAB      TAB      Ectopic      Multiple  0   Live Births  3            Home  Medications    Prior to Admission medications   Medication Sig Start Date End Date Taking? Authorizing Provider  Prenatal Vit-Fe Fumarate-FA (PRENATAL MULTIVITAMIN) TABS tablet Take 1 tablet by mouth daily at 12 noon.    [provider]  sertraline (ZOLOFT) 50 MG tablet Take 50 mg by mouth daily.    [provider]    Family History Family History  Problem Relation Age of Onset  . Heart disease Father   . Hypertension Father   . Heart disease Mother   . Hypertension Mother   . Thyroid disease Mother   . Diabetes Maternal Grandfather     Social History Social History   Tobacco Use  . Smoking status: Never Smoker  . Smokeless tobacco: Never Used  Substance Use Topics  . Alcohol use: No  . Drug use: No     Allergies   Patient has no known allergies.   Review of Systems Review of Systems  Constitutional: Negative for chills and fever.  HENT: Negative for sore throat.   Eyes: Negative for pain and visual disturbance.  Respiratory: Negative for shortness of breath.   Cardiovascular: Negative for chest pain.  Gastrointestinal: Negative for abdominal pain.  Genitourinary: Negative for  dysuria.  Musculoskeletal: Negative for neck pain.  Skin: Negative for rash.  Neurological: Positive for headaches. Negative for seizures.     Physical Exam Updated Vital Signs There were no vitals taken for this visit.  Physical Exam  Constitutional: She appears well-developed and well-nourished. No distress.  HENT:  Head: Normocephalic and atraumatic.  Eyes: Conjunctivae are normal.  Neck: Neck supple.  Cardiovascular: Normal rate and regular rhythm.  No murmur heard. Pulmonary/Chest: Effort normal and breath sounds normal. No respiratory distress.  Abdominal: Soft. There is no tenderness.  Musculoskeletal: She exhibits no edema, tenderness or deformity.  Neurological: She is alert.  Skin: Skin is warm and dry.  Psychiatric: She has a normal mood and affect.   Nursing note and vitals reviewed.    ED Treatments / Results  Labs (all labs ordered are listed, but only abnormal results are displayed) Labs Reviewed  I-STAT CHEM 8, ED - Abnormal; Notable for the following components:      Result Value   Glucose, Bld 100 (*)    Calcium, Ion 1.12 (*)    TCO2 21 (*)    All other components within normal limits  ETHANOL  PROTIME-INR  APTT  CBC  DIFFERENTIAL  COMPREHENSIVE METABOLIC PANEL  I-STAT TROPONIN, ED  I-STAT BETA HCG BLOOD, ED ([facility], [facility], [facility] ONLY)    EKG EKG Interpretation  Date/Time:  Tuesday March 02 2018 13:06:18 EDT Ventricular Rate:  70 PR Interval:    QRS Duration: 90 QT Interval:  416 QTC Calculation: 449 R Axis:   85 Text Interpretation:  Sinus rhythm Borderline short PR interval compared with 10/06 no significant change Confirmed by Meridee Score [phone]) on 03/02/2018 1:14:49 PM   Radiology Ct Angio Head W Or Wo Contrast  Result Date: 03/02/2018 CLINICAL DATA:  Right-sided weakness.  Slurred speech. EXAM: CT ANGIOGRAPHY HEAD AND NECK CT PERFUSION BRAIN TECHNIQUE: Multidetector CT imaging of the head and neck was performed using the standard protocol during bolus administration of intravenous contrast. Multiplanar CT image reconstructions and MIPs were obtained to evaluate the vascular anatomy. Carotid stenosis measurements (when applicable) are obtained utilizing NASCET criteria, using the distal internal carotid diameter as the denominator. Multiphase CT imaging of the brain was performed following IV bolus contrast injection. Subsequent parametric perfusion maps were calculated using RAPID software. CONTRAST:  ISOVUE-370 IOPAMIDOL (ISOVUE-370) INJECTION 76% COMPARISON:  CT study earlier same day. FINDINGS: CTA NECK FINDINGS Aortic arch: Normal Right carotid system: Normal Left carotid system: Normal Vertebral arteries: Normal Skeleton: Minimal spondylosis Other neck: Normal Upper chest: Normal Review of the MIP images  confirms the above findings CTA HEAD FINDINGS Anterior circulation: Normal Posterior circulation:  Normal Venous sinuses: Normal Anatomic variants: None Delayed phase: No abnormal enhancement. Review of the MIP images confirms the above findings CT Brain Perfusion Findings: CBF (<30%) Volume: 0mL Perfusion (Tmax>6.0s) volume: 0mL Mismatch Volume: 0mL Infarction Location:None IMPRESSION: Normal CT angiography of the neck and head. Normal CT perfusion. These results were communicated to Dr. Wilford Corner At 12:14 pmon 6/11/2019by text page via the [facility] messaging system. Electronically Signed   By: Paulina Fusi M.D.   On: 03/02/2018 12:15   Ct Angio Neck W Or Wo Contrast  Result Date: 03/02/2018 CLINICAL DATA:  Right-sided weakness.  Slurred speech. EXAM: CT ANGIOGRAPHY HEAD AND NECK CT PERFUSION BRAIN TECHNIQUE: Multidetector CT imaging of the head and neck was performed using the standard protocol during bolus administration of intravenous contrast. Multiplanar CT image reconstructions and MIPs were obtained to evaluate  the vascular anatomy. Carotid stenosis measurements (when applicable) are obtained utilizing NASCET criteria, using the distal internal carotid diameter as the denominator. Multiphase CT imaging of the brain was performed following IV bolus contrast injection. Subsequent parametric perfusion maps were calculated using RAPID software. CONTRAST:  ISOVUE-370 IOPAMIDOL (ISOVUE-370) INJECTION 76% COMPARISON:  CT study earlier same day. FINDINGS: CTA NECK FINDINGS Aortic arch: Normal Right carotid system: Normal Left carotid system: Normal Vertebral arteries: Normal Skeleton: Minimal spondylosis Other neck: Normal Upper chest: Normal Review of the MIP images confirms the above findings CTA HEAD FINDINGS Anterior circulation: Normal Posterior circulation:  Normal Venous sinuses: Normal Anatomic variants: None Delayed phase: No abnormal enhancement. Review of the MIP images confirms the above findings CT  Brain Perfusion Findings: CBF (<30%) Volume: 0mL Perfusion (Tmax>6.0s) volume: 0mL Mismatch Volume: 0mL Infarction Location:None IMPRESSION: Normal CT angiography of the neck and head. Normal CT perfusion. These results were communicated to Dr. Wilford Corner At 12:14 pmon 6/11/2019by text page via the [facility] messaging system. Electronically Signed   By: Paulina Fusi M.D.   On: 03/02/2018 12:15   Mr Brain Wo Contrast  Result Date: 03/02/2018 CLINICAL DATA:  37 year old female with unexplained alteration of consciousness. Slurred speech. Headache. Anxiety. EXAM: MRI HEAD WITHOUT CONTRAST TECHNIQUE: Multiplanar, multiecho pulse sequences of the brain and surrounding structures were obtained without intravenous contrast. COMPARISON:  CT head, CTA head neck, and CT perfusion all performed earlier today. FINDINGS: Brain: No evidence for acute infarction, hemorrhage, mass lesion, hydrocephalus, or extra-axial fluid. Normal cerebral volume. There are a few small foci of predominantly subcortical greater than periventricular white matter signal abnormality, nonspecific. The brainstem and cerebellum appear spared. Considerations could include early small vessel disease, complicated migraine, chronic infection, vasculitis, or idiopathic. Demyelinating disease is not favored due to the lack of typical morphology and paucity of periventricular abnormalities. Vascular: Flow voids are maintained throughout the carotid, basilar, and vertebral arteries. There are no areas of chronic hemorrhage. Unremarkable visualized calvarium, skullbase, and cervical vertebrae. Pituitary, pineal, cerebellar tonsils unremarkable. No upper cervical cord lesions. Skull and upper cervical spine: Normal marrow signal. Sinuses/Orbits: Negative. Other: None. IMPRESSION: No acute intracranial findings. Minor foci of predominantly subcortical white matter signal abnormality, nonspecific. Demyelinating disease is not favored; see discussion above. Electronically  Signed   By: Elsie Stain M.D.   On: 03/02/2018 13:10   Ct Cerebral Perfusion W Contrast  Result Date: 03/02/2018 CLINICAL DATA:  Right-sided weakness.  Slurred speech. EXAM: CT ANGIOGRAPHY HEAD AND NECK CT PERFUSION BRAIN TECHNIQUE: Multidetector CT imaging of the head and neck was performed using the standard protocol during bolus administration of intravenous contrast. Multiplanar CT image reconstructions and MIPs were obtained to evaluate the vascular anatomy. Carotid stenosis measurements (when applicable) are obtained utilizing NASCET criteria, using the distal internal carotid diameter as the denominator. Multiphase CT imaging of the brain was performed following IV bolus contrast injection. Subsequent parametric perfusion maps were calculated using RAPID software. CONTRAST:  ISOVUE-370 IOPAMIDOL (ISOVUE-370) INJECTION 76% COMPARISON:  CT study earlier same day. FINDINGS: CTA NECK FINDINGS Aortic arch: Normal Right carotid system: Normal Left carotid system: Normal Vertebral arteries: Normal Skeleton: Minimal spondylosis Other neck: Normal Upper chest: Normal Review of the MIP images confirms the above findings CTA HEAD FINDINGS Anterior circulation: Normal Posterior circulation:  Normal Venous sinuses: Normal Anatomic variants: None Delayed phase: No abnormal enhancement. Review of the MIP images confirms the above findings CT Brain Perfusion Findings: CBF (<30%) Volume: 0mL Perfusion (Tmax>6.0s) volume: 0mL Mismatch  Volume: 0mL Infarction Location:None IMPRESSION: Normal CT angiography of the neck and head. Normal CT perfusion. These results were communicated to Dr. Wilford CornerArora At 12:14 pmon 6/11/2019by text page via the [facility] messaging system. Electronically Signed   By: Paulina FusiMark  Shogry M.D.   On: 03/02/2018 12:15   Ct Head Code Stroke Wo Contrast  Result Date: 03/02/2018 CLINICAL DATA:  Code stroke. Right-sided weakness. Right facial droop. Slurred speech. EXAM: CT HEAD WITHOUT CONTRAST TECHNIQUE:  Contiguous axial images were obtained from the base of the skull through the vertex without intravenous contrast. COMPARISON:  None. FINDINGS: Brain: Normal appearance without evidence of old or acute infarction, mass lesion, hemorrhage, hydrocephalus or extra-axial collection. The patient is asymmetric in the gantry. Vascular: No abnormal vascular finding. Skull: Negative Sinuses/Orbits: Clear/normal Other: None ASPECTS (Alberta Stroke Program Early CT Score) - Ganglionic level infarction (caudate, lentiform nuclei, internal capsule, insula, M1-M3 cortex): 7 - Supraganglionic infarction (M4-M6 cortex): 3 Total score (0-10 with 10 being normal): 10 IMPRESSION: 1. Normal examination. 2. ASPECTS is 10. 3. These results were discussed in person at the time of interpretation on 03/02/2018 at 12:00 pm with Dr. Wilford CornerArora, who verbally acknowledged these results. Electronically Signed   By: Paulina FusiMark  Shogry M.D.   On: 03/02/2018 12:00    Procedures Procedures (including critical care time)  Medications Ordered in ED Medications - No data to display   Initial Impression / Assessment and Plan / ED Course  I have reviewed the triage vital signs and the nursing notes.  Pertinent labs & imaging results that were available during my care of the patient were reviewed by me and considered in my medical decision making (see chart for details).  Clinical Course as of Mar 03 658  Tue Mar 02, 2018  1209 Reviewed the case with Dr. Jerrell BelfastAurora.  CT CTA and perfusion are all negative.  She is an MRI now.  He will get back to me with recommendations.   [MB]  1259 Returns from MRI.  She still had a little bit of dysarthria and is complaining of a headache.  Awaiting reports of MRI bedside for everything is looked good.    [MB]  1403 Reviewed case with Dr. Jerrell BelfastAurora neurology.  He feels this is most likely complicated migraine and she can be discharged to follow-up outpatient neurology.  Patient symptoms are improving here as her  headache improves.  She is comfortable with the plan for discharge and follow-up outpatient.   [MB]    Clinical Course User Index [MB] Terrilee FilesButler, Michael C, MD     Final Clinical Impressions(s) / ED Diagnoses   Final diagnoses:  Complicated migraine    ED Discharge Orders    None       Terrilee FilesButler, Michael C, MD 03/03/18 [phone]

## 2018-03-02 NOTE — Consult Note (Signed)
Neurology Consultation  Reason for Consult: Code stroke altered mental status Referring Physician: Dr. Charm BargesButler  CC: Altered mental status  History is obtained from: EMS, patient's husband over the phone and then in person, patient, chart  HPI: Tracy GordonHannah M Tran is a 37 y.o. female was a past medical history of anxiety, pregnancy-induced hypertension, and her usual state of health till about 10 AM when she had a sudden onset of difficulty talking and appeared confused. Per EMS report, she was working at home upstairs, did not feel right, texted her husband that she was not feeling well, and when he saw her she had slurred speech.  He called EMS.  Upon EMS arrival, they noted her to have a right facial droop and difficulty talking with no obvious weakness or sensory loss. A code stroke was called since she was within the window for IV TPA and [facility]. Upon my initial assessment, she had an NIH of 2-1 for aphasia 1 for dysarthria/stuttering speech. No focal cranial nerve, motor or sensory deficits were noted. She was taken for a stat CT of the head, which was unremarkable.  CT angiogram of the head and neck and CT perfusion studies also were unremarkable. I sent her for a stat MRI of the brain to rule out any evidence of embolic phenomena/stroke that might have been missed on the CT given her focal neurological deficits.  No history of migraines, but today has headache that started prior to the symptoms.   LKW: 10 AM on 03/02/2018 tpa given?: no, low NIH, inconsistent exam Premorbid modified Rankin scale (mRS):0  NIH-2  ROS: ROS was performed and is negative except as noted in the HPI. Patient denies any recent stressors but husband endorses multiple stressors that the patient has with her family. No fevers chills, No chest pain, palpitations, No shortness of breath or cough, No abdominal pain nausea vomiting No homicidal suicidal ideation,  No easy bleeding or bruising   Past Medical History:  Diagnosis Date  . Anxiety   . Pregnancy induced hypertension    with previous pregnancy  . Raynaud disease   . Vaginal Pap smear, abnormal     Family History  Problem Relation Age of Onset  . Heart disease Father   . Hypertension Father   . Heart disease Mother   . Hypertension Mother   . Thyroid disease Mother   . Diabetes Maternal Grandfather    Social History:   reports that she has never smoked. She has never used smokeless tobacco. She reports that she does not drink alcohol or use drugs.   Medications  Current Facility-Administered Medications:  .  iopamidol (ISOVUE-370) 76 % injection, , , ,   Current Outpatient Medications:  .  Prenatal Vit-Fe Fumarate-FA (PRENATAL MULTIVITAMIN) TABS tablet, Take 1 tablet by mouth daily at 12 noon., Disp: , Rfl:  .  sertraline (ZOLOFT) 50 MG tablet, Take 50 mg by mouth daily., Disp: , Rfl:    Exam: Current vital signs: Wt 82.6 kg (182 lb 1.6 oz)   BMI 28.52 kg/m  Vital signs in last 24 hours: Weight:  [82.6 kg (182 lb 1.6 oz)] 82.6 kg (182 lb 1.6 oz) (06/11 1100) General: Patient is awake alert in no apparent distress H ENT: Normocephalic atraumatic dry mucous membrane Lungs: Clear to auscultation Cardiovascular: S1 is heard, regular rate rhythm Abdomen: Soft nondistended nontender Extremities: Warm well perfused Neurological exam Mental status: Awake alert oriented x3 Speech is  slightly pressured and stuttering. She is able to name most objects on the NIH stroke card but does miss glove and Hemoccult. Repetition is intact. Comprehension is intact. Cranial nerves: Pupils equal round reactive light, extra ocular movements intact, visual fields full, facial sensation and intact symmetrical bilaterally, face is symmetric, auditory acuity intact, palate elevates symmetrically, shoulder shrug intact, tongue midline. Motor exam: Symmetric 5/5 strength in all 4 extremities  with no vertical drift. Sensory exam: Intact to light touch all over with no extinction on double simultaneous stimulation. Cerebellar: Intact finger-nose-finger bilaterally Gait testing was deferred at this time NIH stroke scale-2-[1 for aphasia, 1 for dysarthria] Labs I have reviewed labs in epic and the results pertinent to this consultation are: CBC    Component Value Date/Time   WBC 9.7 07/07/2016 0732   RBC 4.01 07/07/2016 0732   HGB 15.0 03/02/2018 1158   HCT 44.0 03/02/2018 1158   PLT 228 07/07/2016 0732   MCV 87.3 07/07/2016 0732   MCH 29.7 07/07/2016 0732   MCHC 34.0 07/07/2016 0732   RDW 13.9 07/07/2016 0732    CMP     Component Value Date/Time   NA 138 03/02/2018 1158   K 3.6 03/02/2018 1158   CL 103 03/02/2018 1158   GLUCOSE 100 (H) 03/02/2018 1158   BUN 12 03/02/2018 1158   CREATININE 0.80 03/02/2018 1158   PROT 6.6 12/04/2009 1346   ALBUMIN 4.2 12/04/2009 1346   AST 20 12/04/2009 1346   ALT 18 12/04/2009 1346   ALKPHOS 45 12/04/2009 1346   BILITOT 0.5 12/04/2009 1346   Imaging I have reviewed the images obtained  CT-scan of the brain-no acute changes CT angios head and neck as well as CT perfusion of the head unremarkable for any significant stenosis/occlusion in the vessels of the head and neck.  MRI examination of the brain-pending at the time of the dictation  Assessment:  37 year old woman with sudden onset of altered mental status including difficulty speaking and stuttering speech. Her examination is inconsistent with the stuttering speech, which is very difficult to localize and often times is of nonorganic etiology.  That said, she is a young patient and a focal speech deficit, could be a sign of a left MCA stroke that should be first ruled out before making a diagnosis of psychogenic/conversion as an etiology for her presenting symptoms. For that reason, I have requested a stat MRI of the brain be done.  impression: Rule out stroke No  history of migraines, but has headache today-?Complex migraine If all work up is negative, conversion disorder should be in the differential diagnosis given current stressors and acuity of symptoms, inconsistent exam and fast resolution of the symptoms.  Recommendations: Stat MRI of the brain  If the MRI of the brain is negative for stroke or any acute process in the labs do not reveal any abnormality,  No further neurological work up as inpatient. Outpatient neurology followup for headache preventive treatment- recommended Dr[facility] Daisy [facility]ossom at [facility]. Symptomatic treatment of headache per ER.  -- Milon Dikes, MD [facility] Pager: [phone] If 7pm to 7am, please call on call as listed on AMION.  ADDENDUM MRI negative for acute process. Some  Non specific foci of T2 hyperintensity, I do not think they are clinically relevant. Symptomatically much better. NIH 0 Some headache still present but better than before. Speech clear and not aphasic anymore. Recs as above.  -- Milon Dikes, MD [facility] Pager: [phone] If 7pm to 7am, please call on  call as listed on AMION.

## 2018-03-02 NOTE — ED Triage Notes (Signed)
Patient states she was downstairs breast feeding  Her baby and she didn't feel well states she went upstairs to lay down, c/o blurred visons headache and she felt like she couldn't form her words. Texted her husband and told him she didn't feel well, upon FD arrival they stated she had right arm weakness and drooping to right side of her face. Upon arrival to ed bilateral grips equal, able to follow commands and she was able to speak, with  Little difficulty getting words out.

## 2018-03-02 NOTE — ED Notes (Signed)
Transported to MRI , patient states she feels much better  And her speech is clearer.

## 2018-03-02 NOTE — Discharge Instructions (Signed)
You are evaluated in the emergency department for some difficulty speaking along with a headache.  You had multiple tests including CAT scan and MRI that did not show an obvious sign of stroke.  Neurology evaluated you in the emergency department and felt you could go home for further follow-up with headache specialist Dr. Daisy BlossomAhearn.  Please return if any concerning symptoms.

## 2018-09-22 DIAGNOSIS — E063 Autoimmune thyroiditis: Secondary | ICD-10-CM

## 2018-09-22 HISTORY — DX: Autoimmune thyroiditis: E06.3

## 2019-02-10 IMAGING — CT CT HEAD CODE STROKE
3 series · 15 of 47 positions shown, 18 images · non-contrast
Comparison: None.

CLINICAL DATA: Code stroke. Right-sided weakness. Right facial
droop. Slurred speech.

EXAM:
CT HEAD WITHOUT CONTRAST
TECHNIQUE: Contiguous axial images were obtained from the base of the skull
through the vertex without intravenous contrast.

[Series 3: head 5.0 st · axial · 0.46mm/px · z∈[-509,-384]mm · 9 of 31 slices shown, 12 images]
[im 3/31  brain]
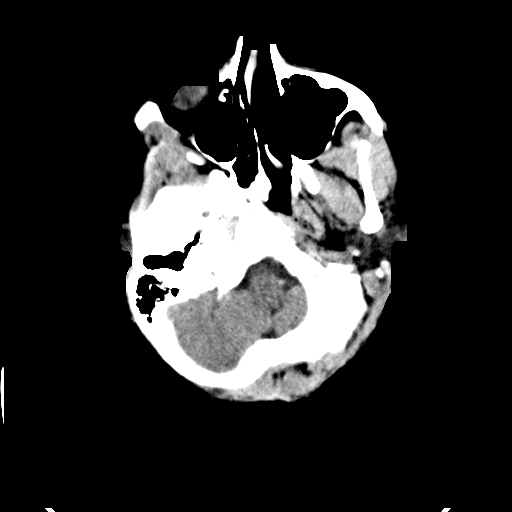
[im 3/31  bone]
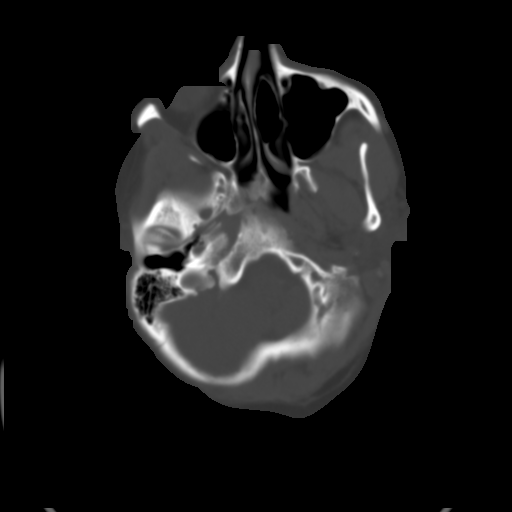
[im 6/31  brain]
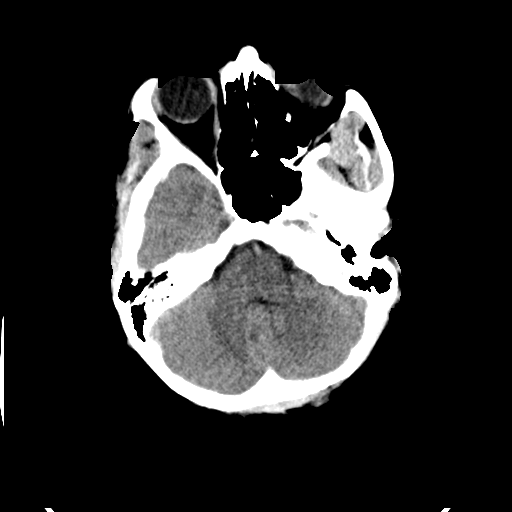
[im 9/31  brain]
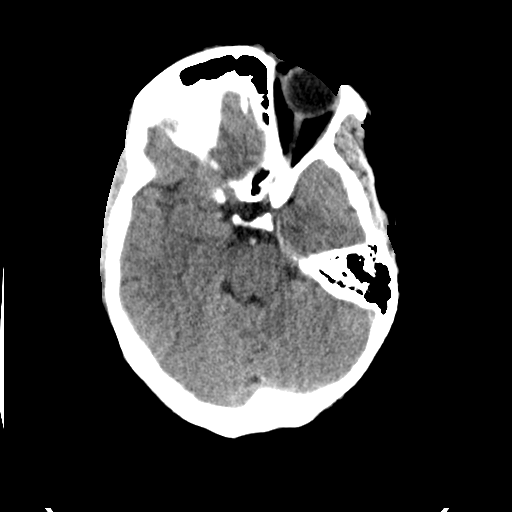
[im 12/31  brain]
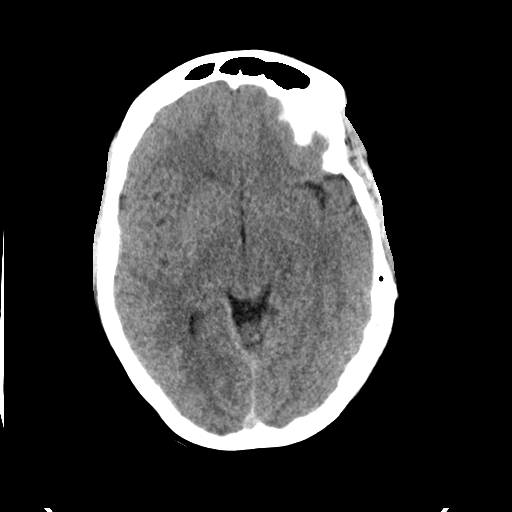
[im 16/31  brain]
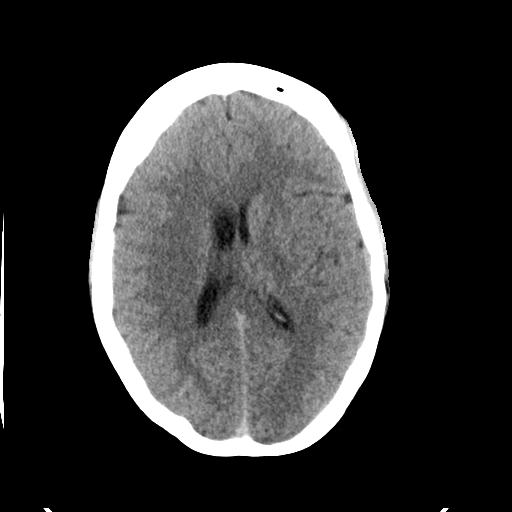
[im 16/31  bone]
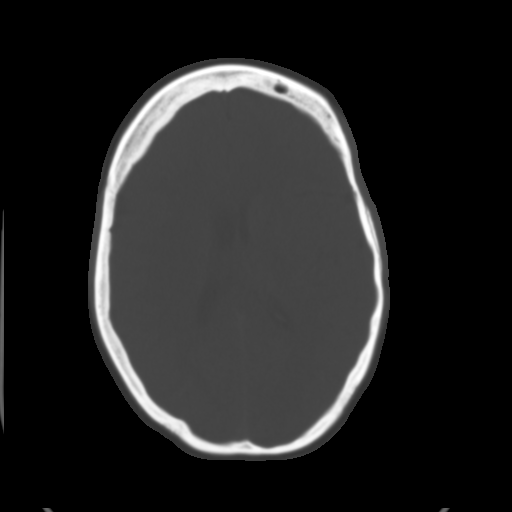
[im 19/31  brain]
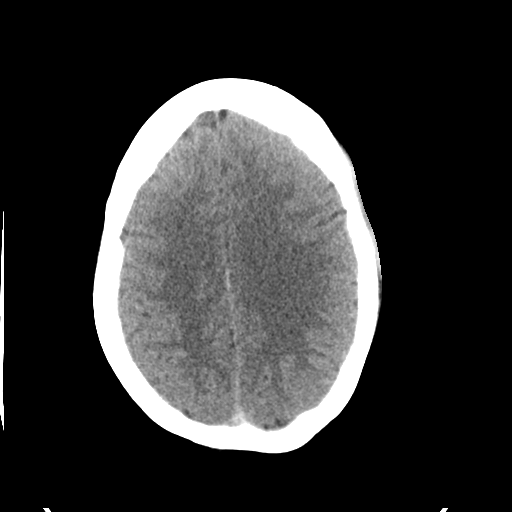
[im 22/31  brain]
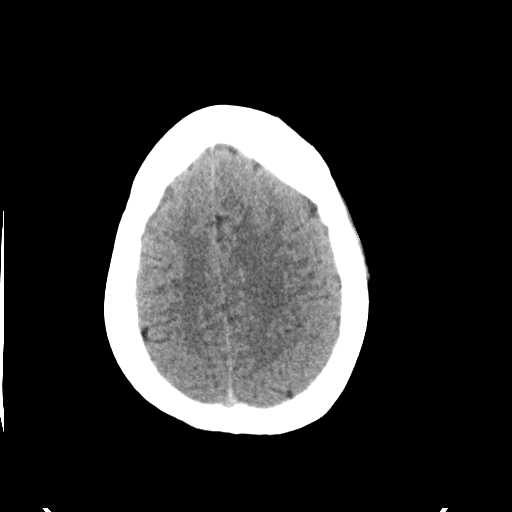
[im 25/31  brain]
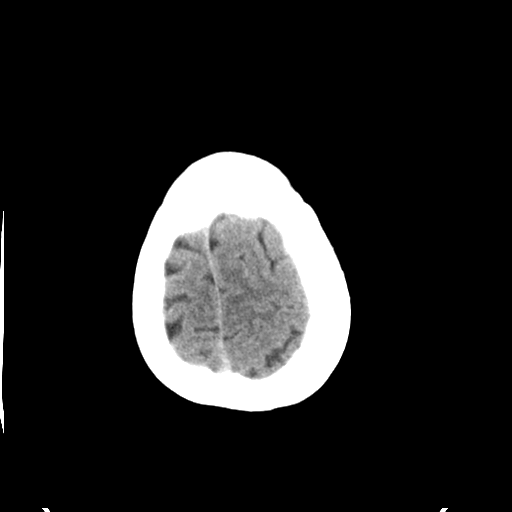
[im 28/31  brain]
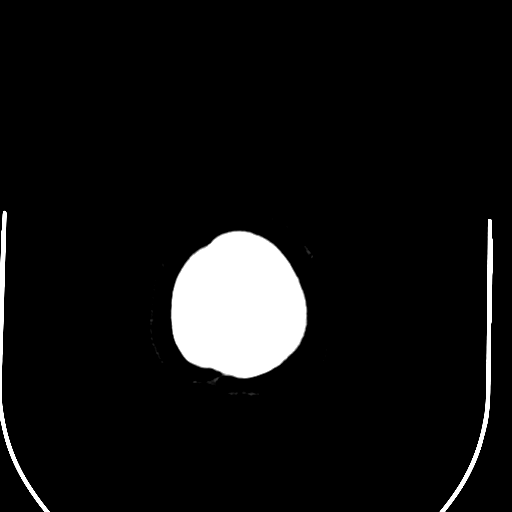
[im 28/31  bone]
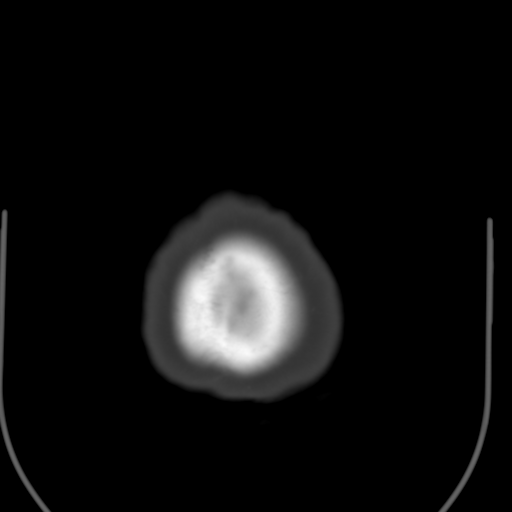

[Series 5: head 3.0 cor st · coronal · 0.31mm/px · 3 of 69 slices shown]
[im 23/69  brain]
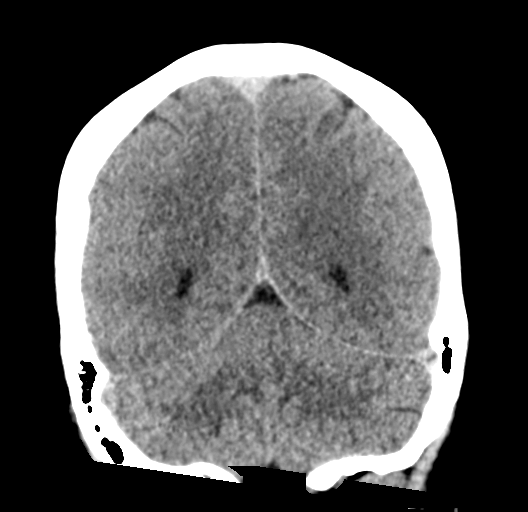
[im 31/69  brain]
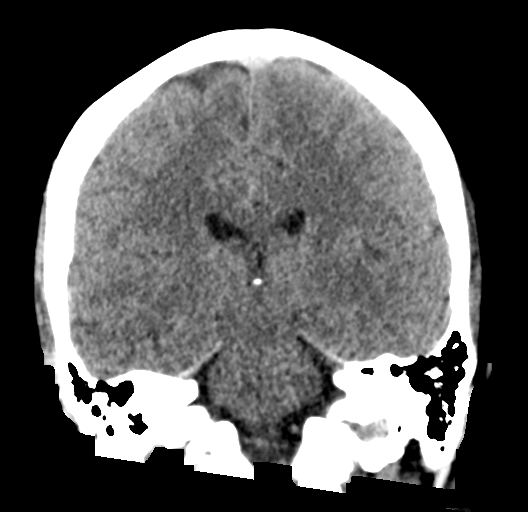
[im 38/69  brain]
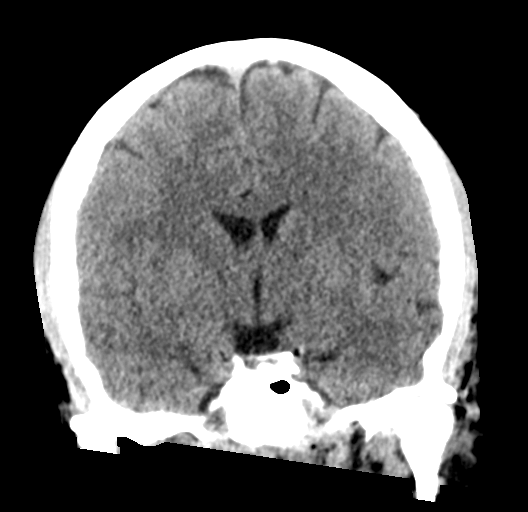

[Series 6: head 3.0 sag st · sagittal · 0.31mm/px · 3 of 60 slices shown]
[im 20/60  brain]
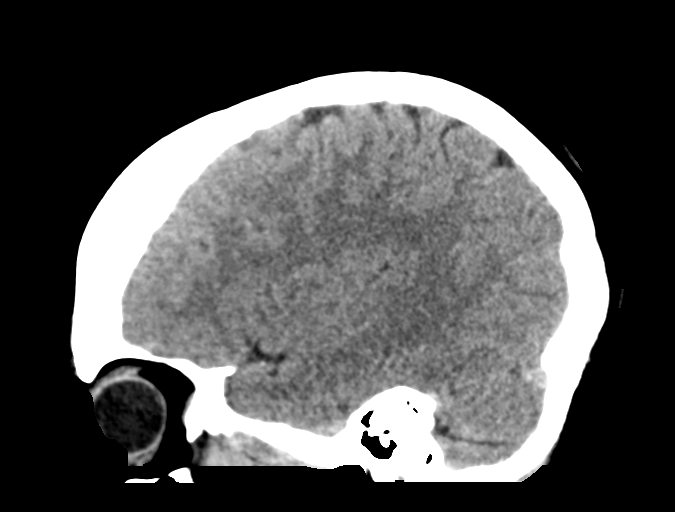
[im 30/60  brain]
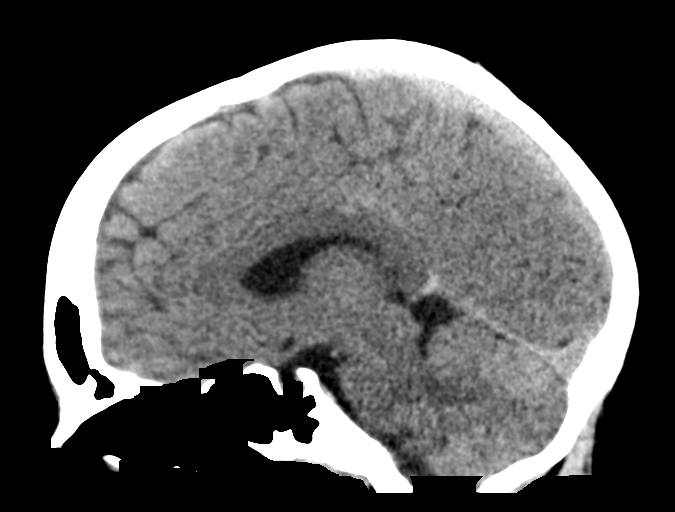
[im 40/60  brain]
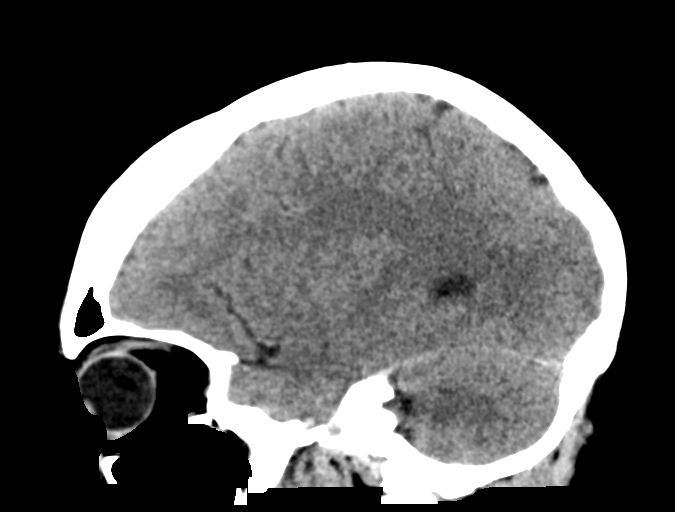

[15 of 47 positions shown; findings below may reference images not displayed]

FINDINGS: Brain: Normal appearance without evidence of old or acute
infarction, mass lesion, hemorrhage, hydrocephalus or extra-axial
collection. The patient is asymmetric in the gantry.

Vascular: No abnormal vascular finding.

Skull: Negative

Sinuses/Orbits: Clear/normal

Other: None

ASPECTS (Alberta Stroke Program Early CT Score)

- Ganglionic level infarction (caudate, lentiform nuclei, internal
capsule, insula, M1-M3 cortex): 7

- Supraganglionic infarction (M4-M6 cortex): 3

Total score (0-10 with 10 being normal): 10
IMPRESSION: 1. Normal examination.
2. ASPECTS is 10.
3. These results were discussed in person at the time of
interpretation on 03/02/2018 at [DATE] with Dr. Hemani, who verbally
acknowledged these results.

## 2019-02-22 ENCOUNTER — Other Ambulatory Visit: Payer: Self-pay | Admitting: Family Medicine

## 2019-02-22 DIAGNOSIS — M79605 Pain in left leg: Secondary | ICD-10-CM

## 2019-02-22 DIAGNOSIS — R252 Cramp and spasm: Secondary | ICD-10-CM

## 2019-02-22 DIAGNOSIS — M79604 Pain in right leg: Secondary | ICD-10-CM

## 2019-06-29 ENCOUNTER — Telehealth: Payer: Self-pay | Admitting: *Deleted

## 2019-06-29 NOTE — Telephone Encounter (Signed)
Patient phone is disconnected. 

## 2019-07-28 ENCOUNTER — Encounter: Payer: Self-pay | Admitting: General Practice

## 2019-08-03 ENCOUNTER — Other Ambulatory Visit: Payer: Self-pay

## 2019-08-03 DIAGNOSIS — Z20822 Contact with and (suspected) exposure to covid-19: Secondary | ICD-10-CM

## 2019-08-05 LAB — NOVEL CORONAVIRUS, NAA: SARS-CoV-2, NAA: NOT DETECTED

## 2019-09-01 ENCOUNTER — Telehealth: Payer: Self-pay | Admitting: *Deleted

## 2019-09-01 NOTE — Telephone Encounter (Signed)
I was able to reach the patient to discuss the monitor referral from Mat Carne, PA-C.  Patient states she cancelled that order because she was no longer having symptoms.

## 2019-10-31 ENCOUNTER — Telehealth: Payer: Self-pay

## 2019-10-31 NOTE — Telephone Encounter (Signed)

## 2019-11-01 ENCOUNTER — Encounter: Payer: Self-pay | Admitting: Plastic Surgery

## 2019-11-01 ENCOUNTER — Other Ambulatory Visit: Payer: Self-pay

## 2019-11-01 ENCOUNTER — Ambulatory Visit (INDEPENDENT_AMBULATORY_CARE_PROVIDER_SITE_OTHER): Payer: BC Managed Care – PPO | Admitting: Plastic Surgery

## 2019-11-01 DIAGNOSIS — M546 Pain in thoracic spine: Secondary | ICD-10-CM | POA: Diagnosis not present

## 2019-11-01 DIAGNOSIS — G8929 Other chronic pain: Secondary | ICD-10-CM

## 2019-11-01 DIAGNOSIS — N62 Hypertrophy of breast: Secondary | ICD-10-CM

## 2019-11-01 DIAGNOSIS — M549 Dorsalgia, unspecified: Secondary | ICD-10-CM | POA: Insufficient documentation

## 2019-11-01 DIAGNOSIS — M542 Cervicalgia: Secondary | ICD-10-CM | POA: Diagnosis not present

## 2019-11-01 NOTE — Progress Notes (Signed)
Patient ID: Tracy Tran, female    DOB: 1981-02-08, 39 y.o.   MRN: [id number]   Chief Complaint  Patient presents with  . Breast Problem    Mammary Hyperplasia: The patient is a 39 y.o. female with a history of mammary hyperplasia for several years.  She has extremely large breasts causing symptoms that include the following: Back pain in the upper and lower back, including neck pain. She pulls or pins her bra straps to provide better lift and relief of the pressure and pain. She notices relief by holding her breast up manually.  Her shoulder straps cause grooves and pain and pressure that requires padding for relief. Pain medication is sometimes required with motrin and tylenol.  Activities that are hindered by enlarged breasts include: exercise and running.  She also gets rashes under her breast that she has to treat with creams and lotions.  Her breasts are extremely large and fairly symmetric.  She has hyperpigmentation of the inframammary area on both sides.  The sternal to nipple distance on the right is 35 cm and the left is 36 cm.  The IMF distance is 15 cm.  She is 5 feet 7 inches tall and weighs 172 pounds.  Preoperative bra size = 36 G cup. She would like to be a D cup. The estimated excess breast tissue to be removed at the time of surgery = 575 grams on the left and 575 grams on the right.  Mammogram history: She has not had a mammogram and does not have a family history of breast cancer.  She is 3 children and breast-fed all of them.  Her husband had a vasectomy and they are not planning on having anymore children.  She has been to a chiropractor that gives her some temporary relief but it is not long-lasting.  She does not have diabetes and is not a smoker she has not had surgery in the past.  She has hyperlipidemia.   Review of Systems  Constitutional: Positive for activity change. Negative for appetite change.  Eyes: Negative.   Respiratory: Negative for chest tightness.     Cardiovascular: Negative.  Negative for leg swelling.  Gastrointestinal: Negative.  Negative for abdominal distention and abdominal pain.  Endocrine: Negative.   Genitourinary: Negative.   Musculoskeletal: Positive for back pain and neck pain.  Hematological: Negative.   Psychiatric/Behavioral: Negative.     Past Medical History:  Diagnosis Date  . Anxiety   . Pregnancy induced hypertension    with previous pregnancy  . Raynaud disease   . Vaginal Pap smear, abnormal     Past Surgical History:  Procedure Laterality Date  . DILATION AND CURETTAGE OF UTERUS        Current Outpatient Medications:  .  atorvastatin (LIPITOR) 20 MG tablet, TK 1 T PO QD, Disp: , Rfl:  .  Multiple Vitamin (MULTIVITAMIN) tablet, Take 1 tablet by mouth daily., Disp: , Rfl:  .  Prenatal Vit-Fe Fumarate-FA (PRENATAL MULTIVITAMIN) TABS tablet, Take 1 tablet by mouth daily at 12 noon., Disp: , Rfl:  .  sertraline (ZOLOFT) 50 MG tablet, Take 50 mg by mouth daily., Disp: , Rfl:    Objective:   Vitals:   11/01/19 1021  BP: 106/74  Pulse: 84  Temp: 98.2 F (36.8 C)  SpO2: 99%    Physical Exam Vitals and nursing note reviewed.  Constitutional:      Appearance: Normal appearance.  HENT:     Head: Normocephalic and  atraumatic.  Eyes:     Extraocular Movements: Extraocular movements intact.  Cardiovascular:     Rate and Rhythm: Normal rate.     Pulses: Normal pulses.  Pulmonary:     Effort: Pulmonary effort is normal.  Abdominal:     General: Abdomen is flat. There is no distension.  Musculoskeletal:        General: No swelling or deformity. Normal range of motion.  Skin:    General: Skin is warm.     Capillary Refill: Capillary refill takes less than 2 seconds.  Neurological:     General: No focal deficit present.     Mental Status: She is alert and oriented to person, place, and time.  Psychiatric:        Mood and Affect: Mood normal.        Behavior: Behavior normal.        Thought  Content: Thought content normal.     Assessment & Plan:  Chronic bilateral thoracic back pain  Neck pain  Symptomatic mammary hypertrophy  Recommend bilateral breast reduction. Recommend physical therapy and mammogram preoperatively. Pictures were obtained of the patient and placed in the chart with the patient's or guardian's permission.  Wallace Going, DO   The 21st Century Cures Act was signed into law in 2016 which includes the topic of electronic health records.  This provides immediate access to information in MyChart.  This includes consultation notes, operative notes, office notes, lab results and pathology reports.  If you have any questions about what you read please let us know at your next visit or call us at the office.  We are right here with you.

## 2020-03-12 ENCOUNTER — Other Ambulatory Visit (HOSPITAL_COMMUNITY): Payer: Self-pay | Admitting: Nurse Practitioner

## 2020-03-29 MED FILL — LEVOTHYROXINE SODIUM 25 MCG: 25 | 90 days supply | Qty: 90 | Fill #0

## 2020-04-04 MED FILL — SERTRALINE HCL 50 MG TABLET: 50 | 90 days supply | Qty: 135 | Fill #0

## 2020-04-09 MED FILL — ATORVASTATIN 20 MG TABLET: 20 | 90 days supply | Qty: 90 | Fill #0

## 2020-04-23 MED FILL — ATORVASTATIN 20 MG TABLET: 20 | 90 days supply | Qty: 90 | Fill #0

## 2020-05-11 ENCOUNTER — Encounter: Payer: Self-pay | Admitting: Plastic Surgery

## 2020-05-11 ENCOUNTER — Ambulatory Visit (INDEPENDENT_AMBULATORY_CARE_PROVIDER_SITE_OTHER): Payer: No Typology Code available for payment source | Admitting: Plastic Surgery

## 2020-05-11 ENCOUNTER — Other Ambulatory Visit: Payer: Self-pay

## 2020-05-11 VITALS — BP 119/74 | HR 69 | Temp 98.5°F | Ht 67.0 in | Wt 183.4 lb

## 2020-05-11 DIAGNOSIS — N62 Hypertrophy of breast: Secondary | ICD-10-CM

## 2020-05-11 DIAGNOSIS — G8929 Other chronic pain: Secondary | ICD-10-CM

## 2020-05-11 DIAGNOSIS — M546 Pain in thoracic spine: Secondary | ICD-10-CM

## 2020-05-11 NOTE — Progress Notes (Signed)
Patient ID: Tracy Tran, female    DOB: [birth year], 39 y.o.   MRN: [id number]   Chief Complaint  Patient presents with   Advice Only   Breast Problem    Mammary Hyperplasia: The patient is a 39 y.o. female with a history of mammary hyperplasia for several years.  She has extremely large breasts causing symptoms that include the following: Back pain in the upper and lower back, including neck pain. She pulls or pins her bra straps to provide better lift and relief of the pressure and pain. She notices relief by holding her breast up manually.  Her shoulder straps cause grooves and pain and pressure that requires padding for relief. Pain medication is sometimes required with motrin and tylenol.  Activities that are hindered by enlarged breasts include: exercise and running.  She has tried supportive clothing as well as fitted bras without improvement.  Her breasts are extremely large and fairly symmetric with the right side slightly longer than the left.  She has hyperpigmentation of the inframammary area on both sides.  The sternal to nipple distance on the right is 35 cm and the left is 34 cm.  The IMF distance is 26 cm.  She is 5 feet 7 inches tall and weighs 183 pounds.  Preoperative bra size = 38 G/H cup.  She would like to be much much smaller.  The estimated excess breast tissue to be removed at the time of surgery = 650 grams on the left and 650 grams on the right.  Mammogram history: 2021 and was negative. Tobacco use: No tobacco use in the last several years and she is not a diabetic.  She was diagnosed with Hashimoto's thyroiditis in April and has been stable since then.  She has not been able to get her weight off and she has not gone to physical therapy yet.  She has 3 children ages 23 5 and 2.  She has grade 3 ptosis no lumps or bumps noted in her breast at this time.   Review of Systems  Constitutional: Positive for activity change. Negative for appetite change.  Eyes:  Negative.   Respiratory: Negative.  Negative for choking and chest tightness.   Cardiovascular: Negative.  Negative for leg swelling.  Gastrointestinal: Negative for abdominal distention and abdominal pain.  Endocrine: Negative.   Genitourinary: Negative.   Musculoskeletal: Positive for back pain and neck pain.  Skin: Negative.   Neurological: Negative.   Hematological: Negative.   Psychiatric/Behavioral: Negative.     Past Medical History:  Diagnosis Date   Anxiety    Pregnancy induced hypertension    with previous pregnancy   Raynaud disease    Vaginal Pap smear, abnormal     Past Surgical History:  Procedure Laterality Date   DILATION AND CURETTAGE OF UTERUS        Current Outpatient Medications:    atorvastatin (LIPITOR) 20 MG tablet, TK 1 T PO QD, Disp: , Rfl:    Cholecalciferol (VITAMIN D) 50 MCG (2000 UT) tablet, Take 2,000 Units by mouth daily., Disp: , Rfl:    levothyroxine (SYNTHROID) 25 MCG tablet, Take 25 mcg by mouth every morning., Disp: , Rfl:    Multiple Vitamin (MULTIVITAMIN) tablet, Take 1 tablet by mouth daily., Disp: , Rfl:    sertraline (ZOLOFT) 50 MG tablet, Take 50 mg by mouth daily., Disp: , Rfl:    Objective:   Vitals:   05/11/20 1006  BP: 119/74  Pulse: 69  Temp:  98.5 F (36.9 C)  SpO2: 98%    Physical Exam Vitals and nursing note reviewed.  Constitutional:      Appearance: Normal appearance.  HENT:     Head: Normocephalic and atraumatic.  Eyes:     Extraocular Movements: Extraocular movements intact.  Cardiovascular:     Rate and Rhythm: Normal rate.     Pulses: Normal pulses.  Pulmonary:     Effort: Pulmonary effort is normal. No respiratory distress.  Abdominal:     General: Abdomen is flat. There is no distension.     Tenderness: There is no abdominal tenderness.  Skin:    General: Skin is warm.     Capillary Refill: Capillary refill takes less than 2 seconds.  Neurological:     General: No focal deficit present.      Mental Status: She is alert and oriented to person, place, and time.  Psychiatric:        Mood and Affect: Mood normal.        Behavior: Behavior normal.        Thought Content: Thought content normal.     Assessment & Plan:  Symptomatic mammary hypertrophy  Chronic bilateral thoracic back pain  The patient is a very good candidate for breast reduction.  I would like her to go ahead and start physical therapy.  This will give [place] time to make sure her Hashimoto's thyroiditis is stabilized.  I am also going to refer her [facility].  I think that she would do very well with some weight loss to have a better long-term result from a breast reduction surgery.  Patient is in agreement.  We will see her back in 3 to 6 months.  Pictures were obtained of the patient and placed in the chart with the patient's or guardian's permission.   Alena Bills Ezri Landers, DO

## 2020-05-22 MED FILL — AMOXICILLIN 875 MG TABS: 875 | 10 days supply | Qty: 20 | Fill #0

## 2020-06-01 MED FILL — LEVOTHYROXINE 50 MCG TABLET: 50 | 30 days supply | Qty: 30 | Fill #0

## 2020-06-04 ENCOUNTER — Ambulatory Visit: Payer: No Typology Code available for payment source | Attending: Plastic Surgery | Admitting: Physical Therapy

## 2020-06-04 ENCOUNTER — Encounter: Payer: Self-pay | Admitting: Physical Therapy

## 2020-06-04 ENCOUNTER — Other Ambulatory Visit: Payer: Self-pay

## 2020-06-04 DIAGNOSIS — M546 Pain in thoracic spine: Secondary | ICD-10-CM | POA: Diagnosis present

## 2020-06-04 DIAGNOSIS — M542 Cervicalgia: Secondary | ICD-10-CM | POA: Diagnosis present

## 2020-06-04 DIAGNOSIS — R293 Abnormal posture: Secondary | ICD-10-CM

## 2020-06-04 NOTE — Therapy (Signed)
[facility] [address] [place], [place], [address] Phone: [phone]   Fax:  [phone]  Physical Therapy Evaluation  Patient Details  Name: Tracy Tran MRN: [id number] Date of Birth: [birth year] Referring Provider (PT): Dr. Foster Simpson   Encounter Date: 06/04/2020   PT End of Session - 06/04/20 1226    Visit Number 1    Number of Visits 8    Date for PT Re-Evaluation 07/30/20    PT Start Time 1138    PT Stop Time 1221    PT Time Calculation (min) 43 min    Activity Tolerance Patient tolerated treatment well    Behavior During Therapy [facility] for tasks assessed/performed           Past Medical History:  Diagnosis Date  . Anxiety   . Pregnancy induced hypertension    with previous pregnancy  . Raynaud disease   . Vaginal Pap smear, abnormal     Past Surgical History:  Procedure Laterality Date  . DILATION AND CURETTAGE OF UTERUS      There were no vitals filed for this visit.    Subjective Assessment - 06/04/20 1141    Subjective Patient has had pain in neck, shoulder and mid back for years, since about mid 20's.  Her wgt has fluctuated due to Hashimotos, diagnosed this year.   She is hopeful with getting surgery will help reduce strain on neck.   She wears 2 bras and a waist support.  She has difficulty sitting, exercising (running) walking upright .  Stands for work and is always in pain .    Pertinent History Hashimoto's    Limitations Lifting;Standing;House hold activities;Sitting;Walking    Diagnostic tests had one at the chiropractor about 2 yr ago    Patient Stated Goals Patient would like to strengthen her back and balance her posture    Currently in Pain? Yes    Pain Score 2     Pain Location Neck    Pain Orientation Right;Left;Lower    Pain Descriptors / Indicators Tightness;Sore;Aching    Pain Type Chronic pain    Pain Radiating Towards mid back    Pain Onset More than a month ago    Pain  Frequency Intermittent    Aggravating Factors  sitting, exercising sometimes ("the wrong way?")    Pain Relieving Factors changing postions , supportive clothing    Effect of Pain on Daily Activities not comfortable with work and recreation    Multiple Pain Sites No              [facility] PT Assessment - 06/04/20 0001      Assessment   Medical Diagnosis symptomatic mammary hypertrophy, neck and back pain     Referring Provider (PT) Dr. Alan Ripper [place]    Onset Date/Surgical Date --   chronic   Prior Therapy No       Precautions   Precautions None      Restrictions   Weight Bearing Restrictions No      Balance Screen   Has the patient fallen in the past 6 months No      Home Environment   Living Environment Private residence    Additional Comments married with 3 children, 14, 5 and 3       Prior Function   Level of Independence Independent    Vocation Part time employment    Media planner    Leisure exercise, involved with kids  Cognition   Overall Cognitive Status Within Functional Limits for tasks assessed      Observation/Other Assessments   Focus on Therapeutic Outcomes (FOTO)  NT due to body part       Sensation   Light Touch Appears Intact      Coordination   Gross Motor Movements are Fluid and Coordinated Not tested      Posture/Postural Control   Posture/Postural Control Postural limitations    Postural Limitations Rounded Shoulders;Forward head;Increased thoracic kyphosis    Posture Comments bilateral humeral internal rotation and scapula abducted from midline       AROM   Cervical Flexion 65    Cervical Extension 45   pain    Cervical - Right Side Bend 30    Cervical - Left Side Bend 40    Cervical - Right Rotation WNL    Cervical - Left Rotation WNL      Strength   Right Shoulder Flexion 4+/5    Right Shoulder ABduction 4+/5    Right Shoulder Horizontal ABduction 4/5    Left Shoulder Flexion 4+/5    Left Shoulder  ABduction 4+/5    Left Shoulder Horizontal ABduction 4/5      Palpation   Spinal mobility hypomobile in lower cervicals to mid thoracic     Palpation comment Patient sore in rhomboids, upper traps. tender, painful throughout posterior cervicals, paraspinals along cervical and thoracic spine.        Ambulation/Gait   Gait Comments no deviations            Objective measurements completed on examination: See above findings.       [facility] Adult PT Treatment/Exercise - 06/04/20 0001      Lumbar Exercises: Stretches   Other Lumbar Stretch Exercise thoracic mobilization with foam roller horizontally, multi levels       Shoulder Exercises: Standing   Horizontal ABduction Strengthening;Both;15 reps;Theraband    Theraband Level (Shoulder Horizontal ABduction) Level 4 (Blue)      Shoulder Exercises: ROM/Strengthening   "W" Arms wall angels x 10       Shoulder Exercises: Stretch   Corner Stretch 3 reps;30 seconds                  PT Education - 06/04/20 1256    Education Details PT/POC, HEP , posture and rationale for PT despite having surgery    Person(s) Educated Patient    Methods Explanation;Handout;Demonstration;Verbal cues    Comprehension Verbalized understanding;Returned demonstration            PT Short Term Goals - 06/04/20 1256      PT SHORT TERM GOAL #1   Title Pt will be I with HEP for posture, neck and back pain    Time 4    Period Weeks    Status New    Target Date 07/02/20      PT SHORT TERM GOAL #2   Title Pt will be able to note an improvement in neck and shoulder pain with sitting for driving, becoming less intense and more intermittent.    Time 4    Period Weeks    Status New    Target Date 07/02/20             PT Long Term Goals - 06/04/20 1258      PT LONG TERM GOAL #1   Title Pt will be able to relieve neck and back pain with HEP (up to several hours)    Time  8    Period Weeks    Status New    Target Date 07/30/20      PT  LONG TERM GOAL #2   Title Pt will be able to show corrected posture in standing and sitting without cues.    Time 8    Period Weeks    Status New    Target Date 07/30/20      PT LONG TERM GOAL #3   Title Pt will be able to go without waist support for standing activities , 50% of the time    Time 8    Period Weeks    Status New    Target Date 07/30/20      PT LONG TERM GOAL #4   Title Pt will be able to perform plank for 30 sec (modified as needed) without increased pain    Time 8    Period Weeks    Status New    Target Date 07/30/20                  Plan - 06/04/20 1256    Clinical Impression Statement Patient presents with neck and back pain, low complexity eval, which has been chronic and hasnow become more bothersome with recent weight gain.  She understands that regardless if she has surgery, she will need improved posture and awareness of her body position.  She has min UE weakness, postural imbalances and spinal stiffness.  She was given initial HEP to promote thoracic extension and support her spine with work and recreation.    Personal Factors and Comorbidities Comorbidity 1;Time since onset of injury/illness/exacerbation    Comorbidities Hashimoto's disease    Examination-Activity Limitations Lift;Carry;Locomotion Level    Examination-Participation Restrictions Interpersonal Relationship;Occupation;Community Activity;Driving    Stability/Clinical Decision Making Stable/Uncomplicated    Clinical Decision Making Low    Rehab Potential Excellent    PT Frequency 1x / week    PT Duration 8 weeks    PT Treatment/Interventions ADLs/Self Care Home Management;Cryotherapy;Moist Heat;Functional mobility training;Therapeutic exercise;Therapeutic activities;Neuromuscular re-education;Manual techniques;Patient/family education;Dry needling;Taping;Spinal Manipulations    PT Next Visit Plan manual (soft tissue, T-L junction)  check HEP, posture/core, UBE    PT Home Exercise Plan  [id number]    Consulted and Agree with Plan of Care Patient           Patient will benefit from skilled therapeutic intervention in order to improve the following deficits and impairments:  Decreased activity tolerance, Decreased range of motion, Decreased strength, Hypomobility, Increased fascial restricitons, Impaired UE functional use, Postural dysfunction, Impaired flexibility, Decreased mobility, Pain  Visit Diagnosis: Cervicalgia  Pain in thoracic spine  Abnormal posture     Problem List Patient Active Problem List   Diagnosis Date Noted  . Back pain 11/01/2019  . Neck pain 11/01/2019  . Symptomatic mammary hypertrophy 11/01/2019  . Pregnancy 07/07/2016  . Term pregnancy 07/07/2016  . IUGR (intrauterine growth restriction) 10/01/2014  . DYSLIPIDEMIA 05/10/2009  . TOBACCO ABUSE 05/10/2009    Tracy Tran 06/04/2020, 1:10 PM  [facility] [address] [place], [place], [address] Phone: [phone]   Fax:  [phone]  Name: Tracy Tran MRN: [id number] Date of Birth: 11/09/1980   Karie Mainland, PT 06/04/20 1:10 PM Phone: [phone] Fax: [phone]

## 2020-06-13 ENCOUNTER — Other Ambulatory Visit: Payer: Self-pay

## 2020-06-13 ENCOUNTER — Ambulatory Visit: Payer: No Typology Code available for payment source | Admitting: Physical Therapy

## 2020-06-13 ENCOUNTER — Encounter: Payer: Self-pay | Admitting: Physical Therapy

## 2020-06-13 DIAGNOSIS — M546 Pain in thoracic spine: Secondary | ICD-10-CM

## 2020-06-13 DIAGNOSIS — M542 Cervicalgia: Secondary | ICD-10-CM | POA: Diagnosis not present

## 2020-06-13 DIAGNOSIS — R293 Abnormal posture: Secondary | ICD-10-CM

## 2020-06-13 NOTE — Therapy (Signed)
[facility] [address] [place], [place], [address] Phone: [phone]   Fax:  [phone]  Physical Therapy Treatment  Patient Details  Name: Tracy Tran MRN: [id number] Date of Birth: 10-20-1980 Referring Provider (PT): Dr. Foster Simpson   Encounter Date: 06/13/2020   PT End of Session - 06/13/20 1408    Visit Number 2    Number of Visits 8    Date for PT Re-Evaluation 07/30/20    PT Start Time 1400    PT Stop Time 1445    PT Time Calculation (min) 45 min    Activity Tolerance Patient tolerated treatment well    Behavior During Therapy [facility] for tasks assessed/performed           Past Medical History:  Diagnosis Date  . Anxiety   . Pregnancy induced hypertension    with previous pregnancy  . Raynaud disease   . Vaginal Pap smear, abnormal     Past Surgical History:  Procedure Laterality Date  . DILATION AND CURETTAGE OF UTERUS      There were no vitals filed for this visit.   Subjective Assessment - 06/13/20 1402    Subjective I have a baseline pain in neck and shoulder , 1/10.  Has been doing the exercises with the band    Currently in Pain? Yes    Pain Score 1     Pain Location Neck    Pain Orientation Right;Left    Pain Descriptors / Indicators Sore    Pain Type Chronic pain    Pain Onset More than a month ago    Pain Frequency Intermittent    Aggravating Factors  sitting    Pain Relieving Factors changing positions, stretch, supportive               [facility] Adult PT Treatment/Exercise - 06/13/20 0001      Pilates   Pilates Reformer see note       Shoulder Exercises: Supine   Horizontal ABduction Strengthening;Both;15 reps;Theraband    Theraband Level (Shoulder Horizontal ABduction) Level 3 (Green)    Horizontal ABduction Weight (lbs) bilateral and unilateral     Diagonals Both    Theraband Level (Shoulder Diagonals) Level 3 (Green)      Shoulder Exercises: ROM/Strengthening   UBE  (Upper Arm Bike) 5 min in reverse, L1        Shoulder Exercises: Stretch   Corner Stretch Limitations doorway x 3 , hands numb  at 15 sec             Pilates Reformer used for LE/core strength, postural strength, lumbopelvic disassociation and core control.  Exercises included:  Long box Prone 1 Red pulling straps , cues for reducing momentum, using control  Triceps, x 10, horiz abduction x 10    Overhead press 1 Red bilateral x 10 then single x 10 each with PT manually cueing for improving scapular position. Then back to double arms for reinforcment of proper form.    Swan, 1 Red, cues for upper traps, elongation of spine   Standing roll down x 3 , thoracic ext encouraged.  Added rotation of T spine with hands crossed x 3 each side           PT Short Term Goals - 06/04/20 1256      PT SHORT TERM GOAL #1   Title Pt will be I with HEP for posture, neck and back pain    Time 4  Period Weeks    Status New    Target Date 07/02/20      PT SHORT TERM GOAL #2   Title Pt will be able to note an improvement in neck and shoulder pain with sitting for driving, becoming less intense and more intermittent.    Time 4    Period Weeks    Status New    Target Date 07/02/20             PT Long Term Goals - 06/04/20 1258      PT LONG TERM GOAL #1   Title Pt will be able to relieve neck and back pain with HEP (up to several hours)    Time 8    Period Weeks    Status New    Target Date 07/30/20      PT LONG TERM GOAL #2   Title Pt will be able to show corrected posture in standing and sitting without cues.    Time 8    Period Weeks    Status New    Target Date 07/30/20      PT LONG TERM GOAL #3   Title Pt will be able to go without waist support for standing activities , 50% of the time    Time 8    Period Weeks    Status New    Target Date 07/30/20      PT LONG TERM GOAL #4   Title Pt will be able to perform plank for 30 sec (modified as needed) without increased  pain    Time 8    Period Weeks    Status New    Target Date 07/30/20                 Plan - 06/13/20 1409    Clinical Impression Statement Patient worked on posture throughout session.  Prone revealed weakness in lower traps, rhomboids. She is likely not mindful of her shoulder/scapular position when exercising.  She had no increase in pain , just fatigue.    PT Treatment/Interventions ADLs/Self Care Home Management;Cryotherapy;Moist Heat;Functional mobility training;Therapeutic exercise;Therapeutic activities;Neuromuscular re-education;Manual techniques;Patient/family education;Dry needling;Taping;Spinal Manipulations    PT Next Visit Plan manual (soft tissue, T-L junction)  check HEP, posture/core, UBE    PT Home Exercise Plan [id number]; added lower trap setting with red band on wall    Consulted and Agree with Plan of Care Patient           Patient will benefit from skilled therapeutic intervention in order to improve the following deficits and impairments:  Decreased activity tolerance, Decreased range of motion, Decreased strength, Hypomobility, Increased fascial restricitons, Impaired UE functional use, Postural dysfunction, Impaired flexibility, Decreased mobility, Pain  Visit Diagnosis: Cervicalgia  Pain in thoracic spine  Abnormal posture     Problem List Patient Active Problem List   Diagnosis Date Noted  . Back pain 11/01/2019  . Neck pain 11/01/2019  . Symptomatic mammary hypertrophy 11/01/2019  . Pregnancy 07/07/2016  . Term pregnancy 07/07/2016  . IUGR (intrauterine growth restriction) 10/01/2014  . DYSLIPIDEMIA 05/10/2009  . TOBACCO ABUSE 05/10/2009    Carnelia Oscar 06/13/2020, 4:45 PM  [facility] [address] [place], [place], [address] Phone: [phone]   Fax:  [phone]  Name: KRISI AZUA MRN: [id number] Date of Birth: 1981/01/21  Karie Mainland, PT 06/13/20 4:48 PM Phone:  [phone] Fax: [phone]

## 2020-06-26 ENCOUNTER — Encounter: Payer: Self-pay | Admitting: Physical Therapy

## 2020-06-26 ENCOUNTER — Ambulatory Visit: Payer: No Typology Code available for payment source | Attending: Plastic Surgery | Admitting: Physical Therapy

## 2020-06-26 ENCOUNTER — Other Ambulatory Visit: Payer: Self-pay

## 2020-06-26 DIAGNOSIS — M542 Cervicalgia: Secondary | ICD-10-CM | POA: Diagnosis not present

## 2020-06-26 DIAGNOSIS — R293 Abnormal posture: Secondary | ICD-10-CM

## 2020-06-26 DIAGNOSIS — M546 Pain in thoracic spine: Secondary | ICD-10-CM

## 2020-06-26 NOTE — Therapy (Addendum)
[place], [place], [address] Phone: [phone]   Fax:  [phone]  Physical Therapy Treatment/Discharge  Patient Details  Name: Tracy Tran MRN: [id number] Date of Birth: Sep 02, 1981 Referring Provider (PT): Dr. Audelia Hives   Encounter Date: 06/26/2020   PT End of Session - 06/26/20 1009    Visit Number 3    Number of Visits 8    Date for PT Re-Evaluation 07/30/20    PT Start Time 1000    PT Stop Time 1045    PT Time Calculation (min) 45 min           Past Medical History:  Diagnosis Date  . Anxiety   . Pregnancy induced hypertension    with previous pregnancy  . Raynaud disease   . Vaginal Pap smear, abnormal     Past Surgical History:  Procedure Laterality Date  . DILATION AND CURETTAGE OF UTERUS      There were no vitals filed for this visit.   Subjective Assessment - 06/26/20 1006    Subjective The exercises help but still have neck and shoulder , minimal most of time. I was able to run yesterday which is something for me.    Currently in Pain? Yes    Pain Score 1                 [facility] Adult PT Treatment/Exercise - 06/26/20 0001      Self-Care   Self-Care Other Self-Care Comments    Other Self-Care Comments  POC, HEP and DC       Lumbar Exercises: Quadruped   Single Arm Raise 10 reps    Single Arm Raise Weights (lbs) 2    Single Arm Raises Limitations flexion , then horizontal abd     Opposite Arm/Leg Raise Right arm/Left leg;Left arm/Right leg;5 reps    Opposite Arm/Leg Raise Limitations cues for core       Knee/Hip Exercises: Standing   Other Standing Knee Exercises dead lift with and without wgt.  15 lbs KB used dowel for cues, about 20 reps total     Other Standing Knee Exercises single leg hiphinge,warrior 3 with cues       Shoulder Exercises: ROM/Strengthening   UBE (Upper Arm Bike) 5 min in reverse, L1        Shoulder Exercises: Stretch   Other  Shoulder Stretches quadruped cat and camelfor spine mobility    Other Shoulder Stretches upper trunk rotation x 5 each side                     PT Short Term Goals - 06/26/20 1009      PT SHORT TERM GOAL #1   Title Pt will be I with HEP for posture, neck and back pain    Status On-going      PT SHORT TERM GOAL #2   Title Pt will be able to note an improvement in neck and shoulder pain with sitting for driving, becoming less intense and more intermittent.    Baseline can do more but with continued pain    Status On-going             PT Long Term Goals - 06/26/20 1011      PT LONG TERM GOAL #1   Title Pt will be able to relieve neck and back pain with HEP (up to several hours)    Status Achieved      PT  LONG TERM GOAL #2   Title Pt will be able to show corrected posture in standing and sitting without cues.    Status Achieved      PT LONG TERM GOAL #3   Title Pt will be able to go without waist support for standing activities , 50% of the time    Baseline wears in the car for support    Status Partially Met      PT LONG TERM GOAL #4   Title Pt will be able to perform plank for 30 sec (modified as needed) without increased pain    Status Achieved                 Plan - 06/26/20 1013    Clinical Impression Statement Patient was seen today for PT visit.  She mentioned during her session that she is doing HIIT clases, yoga and also teaches yoga.  She has a home gym and can use this equipment as well.  She is at this point, not needing skilled PT services as her pain is minimal and she has the resources to control her symptoms.    PT Treatment/Interventions ADLs/Self Care Home Management;Cryotherapy;Moist Heat;Functional mobility training;Therapeutic exercise;Therapeutic activities;Neuromuscular re-education;Manual techniques;Patient/family education;Dry needling;Taping;Spinal Manipulations    PT Next Visit Plan DC    PT Home Exercise Plan [id number]; added lower  trap setting with red band on wall    Consulted and Agree with Plan of Care Patient           Patient will benefit from skilled therapeutic intervention in order to improve the following deficits and impairments:  Decreased activity tolerance, Decreased range of motion, Decreased strength, Hypomobility, Increased fascial restricitons, Impaired UE functional use, Postural dysfunction, Impaired flexibility, Decreased mobility, Pain  Visit Diagnosis: Cervicalgia  Pain in thoracic spine  Abnormal posture     Problem List Patient Active Problem List   Diagnosis Date Noted  . Back pain 11/01/2019  . Neck pain 11/01/2019  . Symptomatic mammary hypertrophy 11/01/2019  . Pregnancy 07/07/2016  . Term pregnancy 07/07/2016  . IUGR (intrauterine growth restriction) 10/01/2014  . DYSLIPIDEMIA 05/10/2009  . TOBACCO ABUSE 05/10/2009    Keltie Labell 06/26/2020, 4:58 PM  [facility] [address] [place], [place], [address] Phone: [phone]   Fax:  [phone]  Name: Tracy Tran MRN: [id number] Date of Birth: Jan 04, 1981  Raeford Razor, PT 06/26/20 4:58 PM Phone: [phone] Fax: [phone]  PHYSICAL THERAPY DISCHARGE SUMMARY  Visits from Start of Care: 3  Current functional level related to goals / functional outcomes: See above    Remaining deficits: Pain which is minimal, baseline. Min weakness in shoulder girdle.  Deficits can be addressed with given HEP and current high intensity exercise regime.    Education / Equipment: Form for HEP, bands, posture  Plan: Patient agrees to discharge.  Patient goals were partially met. Patient is being discharged due to being pleased with the current functional level.  ?????    Raeford Razor, PT 06/26/20 4:58 PM Phone: [phone] Fax: [phone]

## 2020-07-04 ENCOUNTER — Ambulatory Visit: Payer: No Typology Code available for payment source | Admitting: Physical Therapy

## 2020-07-11 ENCOUNTER — Encounter: Payer: No Typology Code available for payment source | Admitting: Physical Therapy

## 2020-07-31 ENCOUNTER — Encounter: Payer: Self-pay | Admitting: Surgical

## 2020-07-31 ENCOUNTER — Other Ambulatory Visit: Payer: Self-pay | Admitting: Surgical

## 2020-07-31 ENCOUNTER — Other Ambulatory Visit: Payer: Self-pay

## 2020-07-31 ENCOUNTER — Ambulatory Visit (INDEPENDENT_AMBULATORY_CARE_PROVIDER_SITE_OTHER): Payer: No Typology Code available for payment source | Admitting: Surgical

## 2020-07-31 VITALS — BP 118/71 | HR 76 | Temp 98.3°F | Ht 67.0 in | Wt 184.6 lb

## 2020-07-31 DIAGNOSIS — N62 Hypertrophy of breast: Secondary | ICD-10-CM

## 2020-07-31 DIAGNOSIS — M546 Pain in thoracic spine: Secondary | ICD-10-CM

## 2020-07-31 DIAGNOSIS — G8929 Other chronic pain: Secondary | ICD-10-CM

## 2020-07-31 DIAGNOSIS — M542 Cervicalgia: Secondary | ICD-10-CM

## 2020-07-31 MED ORDER — TRAMADOL HCL 50 MG PO TABS: 50.0000 mg | ORAL_TABLET | Freq: Four times a day (QID) | ORAL | 0 refills | Status: DC | PRN

## 2020-07-31 MED ORDER — ONDANSETRON HCL 4 MG PO TABS: 4.0000 mg | ORAL_TABLET | Freq: Three times a day (TID) | ORAL | 0 refills | Status: DC | PRN

## 2020-07-31 MED ORDER — CEPHALEXIN 500 MG PO CAPS: 500.0000 mg | ORAL_CAPSULE | Freq: Four times a day (QID) | ORAL | 0 refills | Status: DC

## 2020-07-31 MED FILL — ONDANSETRON HCL 4 MG TABLET: 4 | 7 days supply | Qty: 20 | Fill #0

## 2020-07-31 MED FILL — traMADol HCL 50 MG TABS: 50 | 5 days supply | Qty: 20 | Fill #0

## 2020-07-31 MED FILL — CEPHALEXIN 500 MG CAPSULE: 500 | 3 days supply | Qty: 12 | Fill #0

## 2020-07-31 NOTE — Progress Notes (Signed)
Patient ID: Tracy Tran, female    DOB: 1981/01/17, 39 y.o.   MRN: [id number]  Chief Complaint  Patient presents with  . Pre-op Exam      ICD-10-CM   1. Symptomatic mammary hypertrophy  N62   2. Chronic bilateral thoracic back pain  M54.6    G89.29   3. Neck pain  M54.2      History of Present Illness: Tracy Tran is a 39 y.o.  female  with a history of macromastia.  She presents for preoperative evaluation for upcoming procedure, bilateral breast reduction with possible liposuction, scheduled for 08/15/2020 with Dr. Ulice Bold  The patient has not had problems with anesthesia. No history of DVT/PE.  No family history of DVT/PE.  No family or personal history of bleeding or clotting disorders.  Patient is not currently taking any blood thinners.  No history of CVA/MI.   Summary of Previous Visit: STN on the right is 35 cm and the left is 34 cm.  IMF is 26 cm.  Preoperative bra size is 38 G/H cup.  Patient would like to much smaller.  Estimated excess breast tissue to remove the time of surgery 650 g on the left and 650 g on the right.  Patient had a recent mammogram which was negative.  Patient has not used tobacco in the last several years.  Patient is not a diabetic.  Job: Pharmacologist  PMH Significant for: anxiety, Hashimoto's thyroiditis, dyslipidemia.  Patient reports that she is very nervous for surgery, she has had anesthesia in the past with wisdom teeth extraction.  She reports that her mother had a collapsed lung with her breast reduction surgery, no other history of anesthetic complications.   Past Medical History: Allergies: No Known Allergies  Current Medications:  Current Outpatient Medications:  .  atorvastatin (LIPITOR) 20 MG tablet, TK 1 T PO QD, Disp: , Rfl:  .  Cholecalciferol (VITAMIN D) 50 MCG (2000 UT) tablet, Take 2,000 Units by mouth daily., Disp: , Rfl:  .  levothyroxine (SYNTHROID) 75 MCG tablet, Take 75 mcg by mouth daily., Disp: , Rfl:    .  Multiple Vitamin (MULTIVITAMIN) tablet, Take 1 tablet by mouth daily., Disp: , Rfl:  .  sertraline (ZOLOFT) 50 MG tablet, Take 100 mg by mouth daily. , Disp: , Rfl:  .  cephALEXin (KEFLEX) 500 MG capsule, Take 1 capsule (500 mg total) by mouth 4 (four) times daily for 3 days., Disp: 12 capsule, Rfl: 0 .  ondansetron (ZOFRAN) 4 MG tablet, Take 1 tablet (4 mg total) by mouth every 8 (eight) hours as needed for nausea or vomiting., Disp: 20 tablet, Rfl: 0 .  traMADol (ULTRAM) 50 MG tablet, Take 1 tablet (50 mg total) by mouth every 6 (six) hours as needed for up to 5 days., Disp: 20 tablet, Rfl: 0  Past Medical Problems: Past Medical History:  Diagnosis Date  . Anxiety   . Pregnancy induced hypertension    with previous pregnancy  . Raynaud disease   . Vaginal Pap smear, abnormal     Past Surgical History: Past Surgical History:  Procedure Laterality Date  . DILATION AND CURETTAGE OF UTERUS      Social History: Social History   Socioeconomic History  . Marital status: Single    Spouse name: Not on file  . Number of children: Not on file  . Years of education: Not on file  . Highest education level: Not on file  Occupational History  .  Not on file  Tobacco Use  . Smoking status: Former Smoker    Quit date: 10/31/2017    Years since quitting: 2.7  . Smokeless tobacco: Never Used  Substance and Sexual Activity  . Alcohol use: No  . Drug use: No  . Sexual activity: Yes    Birth control/protection: None  Other Topics Concern  . Not on file  Social History Narrative  . Not on file   Social Determinants of Health   Financial Resource Strain:   . Difficulty of Paying Living Expenses: Not on file  Food Insecurity:   . Worried About Programme researcher, broadcasting/film/video in the Last Year: Not on file  . Ran Out of Food in the Last Year: Not on file  Transportation Needs:   . Lack of Transportation (Medical): Not on file  . Lack of Transportation (Non-Medical): Not on file  Physical  Activity:   . Days of Exercise per Week: Not on file  . Minutes of Exercise per Session: Not on file  Stress:   . Feeling of Stress : Not on file  Social Connections:   . Frequency of Communication with Friends and Family: Not on file  . Frequency of Social Gatherings with Friends and Family: Not on file  . Attends Religious Services: Not on file  . Active Member of Clubs or Organizations: Not on file  . Attends Banker Meetings: Not on file  . Marital Status: Not on file  Intimate Partner Violence:   . Fear of Current or Ex-Partner: Not on file  . Emotionally Abused: Not on file  . Physically Abused: Not on file  . Sexually Abused: Not on file    Family History: Family History  Problem Relation Age of Onset  . Heart disease Father   . Hypertension Father   . Heart disease Mother   . Hypertension Mother   . Thyroid disease Mother   . Diabetes Maternal Grandfather     Review of Systems: Review of Systems  Constitutional: Negative.   Respiratory: Negative.   Cardiovascular: Negative.   Gastrointestinal: Negative.     Physical Exam: Vital Signs BP 118/71 (BP Location: Left Arm, Patient Position: Sitting, Cuff Size: Normal)   Pulse 76   Temp 98.3 F (36.8 C) (Oral)   Ht 5\' 7"  (1.702 m)   Wt 184 lb 9.6 oz (83.7 kg)   LMP 07/11/2020 (Approximate)   SpO2 100%   BMI 28.91 kg/m  Physical Exam Constitutional:      General: She is not in acute distress.    Appearance: Normal appearance. She is not ill-appearing.  HENT:     Head: Normocephalic and atraumatic.  Eyes:     Pupils: Pupils are equal, round Neck:     Musculoskeletal: Normal range of motion.  Cardiovascular:     Rate and Rhythm: Normal rate and regular rhythm.     Pulses: Normal pulses.     Heart sounds: Normal heart sounds. No murmur.  Pulmonary:     Effort: Pulmonary effort is normal. No respiratory distress.     Breath sounds: Normal breath sounds. No wheezing.  Abdominal:     General:  Abdomen is flat. There is no distension.     Palpations: Abdomen is soft.     Tenderness: There is no abdominal tenderness.  Musculoskeletal: Normal range of motion.  Skin:    General: Skin is warm and dry.     Findings: No erythema or rash.  Neurological:  General: No focal deficit present.     Mental Status: She is alert and oriented to person, place, and time. Mental status is at baseline.     Motor: No weakness.  Psychiatric:        Mood and Affect: Mood normal.        Behavior: Behavior normal.   Assessment/Plan: The patient is scheduled for bilateral breast reduction with liposuction with Dr. Ulice Bold.  Risks, benefits, and alternatives of procedure discussed, questions answered and consent obtained.    Smoking Status: Quit smoking 2 years ago; Counseling Given?  N/A Last Mammogram: Mammogram within the last year; Results: Negative  Caprini Score: 3, moderate; Risk Factors include: Age, BMI greater than 25, and length of planned surgery. Recommendation for mechanical prophylaxis during surgery. Encourage early ambulation.   Pictures obtained:[email or web address]   Post-op Rx sent to pharmacy: Tramadol, Zofran, Keflex  Patient was provided with the breast reduction and General Surgical Risk consent document and Pain Medication Agreement prior to their appointment.  They had adequate time to read through the risk consent documents and Pain Medication Agreement. We also discussed them in person together during this preop appointment. All of their questions were answered to their satisfaction.  Recommended calling if they have any further questions.  Risk consent form and Pain Medication Agreement to be scanned into patient's chart.  The risk that can be encountered with breast reduction were discussed and include the following but not limited to these:  Breast asymmetry, fluid accumulation, firmness of the breast, inability to breast feed, loss of nipple or areola, skin loss, decrease or no  nipple sensation, fat necrosis of the breast tissue, bleeding, infection, healing delay.  There are risks of anesthesia, changes to skin sensation and injury to nerves or blood vessels.  The muscle can be temporarily or permanently injured.  You may have an allergic reaction to tape, suture, glue, blood products which can result in skin discoloration, swelling, pain, skin lesions, poor healing.  Any of these can lead to the need for revisonal surgery or stage procedures.  A reduction has potential to interfere with diagnostic procedures.  Nipple or breast piercing can increase risks of infection.  This procedure is best done when the breast is fully developed.  Changes in the breast will continue to occur over time.  Pregnancy can alter the outcomes of previous breast reduction surgery, weight gain and weigh loss can also effect the long term appearance.     Electronically signed by: Kermit Balo Anjuli Gemmill, PA-C 07/31/2020 1:47 PM

## 2020-07-31 NOTE — H&P (View-Only) (Signed)
Patient ID: Tracy Tran, female    DOB: 1981/01/17, 39 y.o.   MRN: [id number]  Chief Complaint  Patient presents with  . Pre-op Exam      ICD-10-CM   1. Symptomatic mammary hypertrophy  N62   2. Chronic bilateral thoracic back pain  M54.6    G89.29   3. Neck pain  M54.2      History of Present Illness: Tracy Tran is a 39 y.o.  female  with a history of macromastia.  She presents for preoperative evaluation for upcoming procedure, bilateral breast reduction with possible liposuction, scheduled for 08/15/2020 with Dr. Ulice Bold  The patient has not had problems with anesthesia. No history of DVT/PE.  No family history of DVT/PE.  No family or personal history of bleeding or clotting disorders.  Patient is not currently taking any blood thinners.  No history of CVA/MI.   Summary of Previous Visit: STN on the right is 35 cm and the left is 34 cm.  IMF is 26 cm.  Preoperative bra size is 38 G/H cup.  Patient would like to much smaller.  Estimated excess breast tissue to remove the time of surgery 650 g on the left and 650 g on the right.  Patient had a recent mammogram which was negative.  Patient has not used tobacco in the last several years.  Patient is not a diabetic.  Job: Pharmacologist  PMH Significant for: anxiety, Hashimoto's thyroiditis, dyslipidemia.  Patient reports that she is very nervous for surgery, she has had anesthesia in the past with wisdom teeth extraction.  She reports that her mother had a collapsed lung with her breast reduction surgery, no other history of anesthetic complications.   Past Medical History: Allergies: No Known Allergies  Current Medications:  Current Outpatient Medications:  .  atorvastatin (LIPITOR) 20 MG tablet, TK 1 T PO QD, Disp: , Rfl:  .  Cholecalciferol (VITAMIN D) 50 MCG (2000 UT) tablet, Take 2,000 Units by mouth daily., Disp: , Rfl:  .  levothyroxine (SYNTHROID) 75 MCG tablet, Take 75 mcg by mouth daily., Disp: , Rfl:    .  Multiple Vitamin (MULTIVITAMIN) tablet, Take 1 tablet by mouth daily., Disp: , Rfl:  .  sertraline (ZOLOFT) 50 MG tablet, Take 100 mg by mouth daily. , Disp: , Rfl:  .  cephALEXin (KEFLEX) 500 MG capsule, Take 1 capsule (500 mg total) by mouth 4 (four) times daily for 3 days., Disp: 12 capsule, Rfl: 0 .  ondansetron (ZOFRAN) 4 MG tablet, Take 1 tablet (4 mg total) by mouth every 8 (eight) hours as needed for nausea or vomiting., Disp: 20 tablet, Rfl: 0 .  traMADol (ULTRAM) 50 MG tablet, Take 1 tablet (50 mg total) by mouth every 6 (six) hours as needed for up to 5 days., Disp: 20 tablet, Rfl: 0  Past Medical Problems: Past Medical History:  Diagnosis Date  . Anxiety   . Pregnancy induced hypertension    with previous pregnancy  . Raynaud disease   . Vaginal Pap smear, abnormal     Past Surgical History: Past Surgical History:  Procedure Laterality Date  . DILATION AND CURETTAGE OF UTERUS      Social History: Social History   Socioeconomic History  . Marital status: Single    Spouse name: Not on file  . Number of children: Not on file  . Years of education: Not on file  . Highest education level: Not on file  Occupational History  .  Not on file  Tobacco Use  . Smoking status: Former Smoker    Quit date: 10/31/2017    Years since quitting: 2.7  . Smokeless tobacco: Never Used  Substance and Sexual Activity  . Alcohol use: No  . Drug use: No  . Sexual activity: Yes    Birth control/protection: None  Other Topics Concern  . Not on file  Social History Narrative  . Not on file   Social Determinants of Health   Financial Resource Strain:   . Difficulty of Paying Living Expenses: Not on file  Food Insecurity:   . Worried About Programme researcher, broadcasting/film/video in the Last Year: Not on file  . Ran Out of Food in the Last Year: Not on file  Transportation Needs:   . Lack of Transportation (Medical): Not on file  . Lack of Transportation (Non-Medical): Not on file  Physical  Activity:   . Days of Exercise per Week: Not on file  . Minutes of Exercise per Session: Not on file  Stress:   . Feeling of Stress : Not on file  Social Connections:   . Frequency of Communication with Friends and Family: Not on file  . Frequency of Social Gatherings with Friends and Family: Not on file  . Attends Religious Services: Not on file  . Active Member of Clubs or Organizations: Not on file  . Attends Banker Meetings: Not on file  . Marital Status: Not on file  Intimate Partner Violence:   . Fear of Current or Ex-Partner: Not on file  . Emotionally Abused: Not on file  . Physically Abused: Not on file  . Sexually Abused: Not on file    Family History: Family History  Problem Relation Age of Onset  . Heart disease Father   . Hypertension Father   . Heart disease Mother   . Hypertension Mother   . Thyroid disease Mother   . Diabetes Maternal Grandfather     Review of Systems: Review of Systems  Constitutional: Negative.   Respiratory: Negative.   Cardiovascular: Negative.   Gastrointestinal: Negative.     Physical Exam: Vital Signs BP 118/71 (BP Location: Left Arm, Patient Position: Sitting, Cuff Size: Normal)   Pulse 76   Temp 98.3 F (36.8 C) (Oral)   Ht 5\' 7"  (1.702 m)   Wt 184 lb 9.6 oz (83.7 kg)   LMP 07/11/2020 (Approximate)   SpO2 100%   BMI 28.91 kg/m  Physical Exam Constitutional:      General: She is not in acute distress.    Appearance: Normal appearance. She is not ill-appearing.  HENT:     Head: Normocephalic and atraumatic.  Eyes:     Pupils: Pupils are equal, round Neck:     Musculoskeletal: Normal range of motion.  Cardiovascular:     Rate and Rhythm: Normal rate and regular rhythm.     Pulses: Normal pulses.     Heart sounds: Normal heart sounds. No murmur.  Pulmonary:     Effort: Pulmonary effort is normal. No respiratory distress.     Breath sounds: Normal breath sounds. No wheezing.  Abdominal:     General:  Abdomen is flat. There is no distension.     Palpations: Abdomen is soft.     Tenderness: There is no abdominal tenderness.  Musculoskeletal: Normal range of motion.  Skin:    General: Skin is warm and dry.     Findings: No erythema or rash.  Neurological:  General: No focal deficit present.     Mental Status: She is alert and oriented to person, place, and time. Mental status is at baseline.     Motor: No weakness.  Psychiatric:        Mood and Affect: Mood normal.        Behavior: Behavior normal.   Assessment/Plan: The patient is scheduled for bilateral breast reduction with liposuction with Dr. Ulice Bold.  Risks, benefits, and alternatives of procedure discussed, questions answered and consent obtained.    Smoking Status: Quit smoking 2 years ago; Counseling Given?  N/A Last Mammogram: Mammogram within the last year; Results: Negative  Caprini Score: 3, moderate; Risk Factors include: Age, BMI greater than 25, and length of planned surgery. Recommendation for mechanical prophylaxis during surgery. Encourage early ambulation.   Pictures obtained:[email or web address]   Post-op Rx sent to pharmacy: Tramadol, Zofran, Keflex  Patient was provided with the breast reduction and General Surgical Risk consent document and Pain Medication Agreement prior to their appointment.  They had adequate time to read through the risk consent documents and Pain Medication Agreement. We also discussed them in person together during this preop appointment. All of their questions were answered to their satisfaction.  Recommended calling if they have any further questions.  Risk consent form and Pain Medication Agreement to be scanned into patient's chart.  The risk that can be encountered with breast reduction were discussed and include the following but not limited to these:  Breast asymmetry, fluid accumulation, firmness of the breast, inability to breast feed, loss of nipple or areola, skin loss, decrease or no  nipple sensation, fat necrosis of the breast tissue, bleeding, infection, healing delay.  There are risks of anesthesia, changes to skin sensation and injury to nerves or blood vessels.  The muscle can be temporarily or permanently injured.  You may have an allergic reaction to tape, suture, glue, blood products which can result in skin discoloration, swelling, pain, skin lesions, poor healing.  Any of these can lead to the need for revisonal surgery or stage procedures.  A reduction has potential to interfere with diagnostic procedures.  Nipple or breast piercing can increase risks of infection.  This procedure is best done when the breast is fully developed.  Changes in the breast will continue to occur over time.  Pregnancy can alter the outcomes of previous breast reduction surgery, weight gain and weigh loss can also effect the long term appearance.     Electronically signed by: Kermit Balo Bently Wyss, PA-C 07/31/2020 1:47 PM

## 2020-08-06 ENCOUNTER — Other Ambulatory Visit: Payer: Self-pay

## 2020-08-06 ENCOUNTER — Encounter (HOSPITAL_BASED_OUTPATIENT_CLINIC_OR_DEPARTMENT_OTHER): Payer: Self-pay | Admitting: Plastic Surgery

## 2020-08-13 ENCOUNTER — Other Ambulatory Visit (HOSPITAL_COMMUNITY)
Admission: RE | Admit: 2020-08-13 | Discharge: 2020-08-13 | Disposition: A | Discharge status: Home / Self Care | Payer: No Typology Code available for payment source | Source: Ambulatory Visit | Attending: Plastic Surgery | Admitting: Plastic Surgery

## 2020-08-13 DIAGNOSIS — Z20822 Contact with and (suspected) exposure to covid-19: Secondary | ICD-10-CM | POA: Diagnosis not present

## 2020-08-13 DIAGNOSIS — Z01812 Encounter for preprocedural laboratory examination: Secondary | ICD-10-CM | POA: Diagnosis present

## 2020-08-13 LAB — SARS CORONAVIRUS 2 (TAT 6-24 HRS): SARS Coronavirus 2: NEGATIVE

## 2020-08-15 ENCOUNTER — Encounter (HOSPITAL_BASED_OUTPATIENT_CLINIC_OR_DEPARTMENT_OTHER): Payer: Self-pay | Admitting: Plastic Surgery

## 2020-08-15 ENCOUNTER — Ambulatory Visit (HOSPITAL_BASED_OUTPATIENT_CLINIC_OR_DEPARTMENT_OTHER): Payer: No Typology Code available for payment source | Admitting: Certified Registered"

## 2020-08-15 ENCOUNTER — Ambulatory Visit (HOSPITAL_BASED_OUTPATIENT_CLINIC_OR_DEPARTMENT_OTHER)
Admission: RE | Admit: 2020-08-15 | Discharge: 2020-08-15 | Disposition: A | Discharge status: Home / Self Care | Payer: No Typology Code available for payment source | Attending: Plastic Surgery | Admitting: Plastic Surgery

## 2020-08-15 ENCOUNTER — Other Ambulatory Visit: Payer: Self-pay

## 2020-08-15 ENCOUNTER — Encounter (HOSPITAL_BASED_OUTPATIENT_CLINIC_OR_DEPARTMENT_OTHER)
Admission: RE | Disposition: A | Discharge status: Home / Self Care | Payer: Self-pay | Source: Home / Self Care | Attending: Plastic Surgery

## 2020-08-15 DIAGNOSIS — Z79890 Hormone replacement therapy (postmenopausal): Secondary | ICD-10-CM | POA: Diagnosis not present

## 2020-08-15 DIAGNOSIS — M549 Dorsalgia, unspecified: Secondary | ICD-10-CM | POA: Diagnosis not present

## 2020-08-15 DIAGNOSIS — Z79899 Other long term (current) drug therapy: Secondary | ICD-10-CM | POA: Insufficient documentation

## 2020-08-15 DIAGNOSIS — N62 Hypertrophy of breast: Secondary | ICD-10-CM | POA: Diagnosis present

## 2020-08-15 DIAGNOSIS — Z87891 Personal history of nicotine dependence: Secondary | ICD-10-CM | POA: Insufficient documentation

## 2020-08-15 DIAGNOSIS — M542 Cervicalgia: Secondary | ICD-10-CM | POA: Insufficient documentation

## 2020-08-15 DIAGNOSIS — M5489 Other dorsalgia: Secondary | ICD-10-CM

## 2020-08-15 HISTORY — PX: BREAST REDUCTION SURGERY: SHX8

## 2020-08-15 HISTORY — DX: Hypertrophy of breast: N62

## 2020-08-15 HISTORY — DX: Gastro-esophageal reflux disease without esophagitis: K21.9

## 2020-08-15 HISTORY — DX: Hypothyroidism, unspecified: E03.9

## 2020-08-15 LAB — POCT PREGNANCY, URINE: Preg Test, Ur: NEGATIVE

## 2020-08-15 SURGERY — MAMMOPLASTY, REDUCTION
Anesthesia: General | Site: Breast | Laterality: Bilateral

## 2020-08-15 MED ORDER — SODIUM CHLORIDE 0.9 % IV SOLN: 250.0000 mL | INTRAVENOUS | Status: DC | PRN

## 2020-08-15 MED ORDER — PHENYLEPHRINE HCL-NACL 10-0.9 MG/250ML-% IV SOLN
INTRAVENOUS | Status: DC | PRN
  Administered 2020-08-15: 25 ug/min via INTRAVENOUS

## 2020-08-15 MED ORDER — ACETAMINOPHEN 325 MG PO TABS: 650.0000 mg | ORAL_TABLET | ORAL | Status: DC | PRN

## 2020-08-15 MED ORDER — DEXMEDETOMIDINE (PRECEDEX) IN NS 20 MCG/5ML (4 MCG/ML) IV SYRINGE
PREFILLED_SYRINGE | INTRAVENOUS | Status: AC
  Filled 2020-08-15: qty 5

## 2020-08-15 MED ORDER — PROPOFOL 10 MG/ML IV BOLUS
INTRAVENOUS | Status: DC | PRN
  Administered 2020-08-15: 20 mg via INTRAVENOUS
  Administered 2020-08-15: 60 mg via INTRAVENOUS
  Administered 2020-08-15: 200 mg via INTRAVENOUS

## 2020-08-15 MED ORDER — ACETAMINOPHEN 500 MG PO TABS
1000.0000 mg | ORAL_TABLET | Freq: Once | ORAL | Status: AC
  Administered 2020-08-15: 1000 mg via ORAL

## 2020-08-15 MED ORDER — SCOPOLAMINE 1 MG/3DAYS TD PT72
MEDICATED_PATCH | TRANSDERMAL | Status: AC
  Filled 2020-08-15: qty 1

## 2020-08-15 MED ORDER — SUCCINYLCHOLINE CHLORIDE 200 MG/10ML IV SOSY
PREFILLED_SYRINGE | INTRAVENOUS | Status: AC
  Filled 2020-08-15: qty 10

## 2020-08-15 MED ORDER — DEXMEDETOMIDINE (PRECEDEX) IN NS 20 MCG/5ML (4 MCG/ML) IV SYRINGE
PREFILLED_SYRINGE | INTRAVENOUS | Status: DC | PRN
  Administered 2020-08-15 (×3): 4 ug via INTRAVENOUS

## 2020-08-15 MED ORDER — PROPOFOL 10 MG/ML IV BOLUS
INTRAVENOUS | Status: AC
  Filled 2020-08-15: qty 20

## 2020-08-15 MED ORDER — DEXAMETHASONE SODIUM PHOSPHATE 10 MG/ML IJ SOLN
INTRAMUSCULAR | Status: DC | PRN
  Administered 2020-08-15: 10 mg via INTRAVENOUS

## 2020-08-15 MED ORDER — ONDANSETRON HCL 4 MG/2ML IJ SOLN
INTRAMUSCULAR | Status: DC | PRN
  Administered 2020-08-15: 4 mg via INTRAVENOUS

## 2020-08-15 MED ORDER — FENTANYL CITRATE (PF) 100 MCG/2ML IJ SOLN
INTRAMUSCULAR | Status: AC
  Filled 2020-08-15: qty 2

## 2020-08-15 MED ORDER — FENTANYL CITRATE (PF) 100 MCG/2ML IJ SOLN: 25.0000 ug | INTRAMUSCULAR | Status: DC | PRN

## 2020-08-15 MED ORDER — SCOPOLAMINE 1 MG/3DAYS TD PT72
1.0000 | MEDICATED_PATCH | Freq: Once | TRANSDERMAL | Status: DC
  Administered 2020-08-15: 1.5 mg via TRANSDERMAL

## 2020-08-15 MED ORDER — LIDOCAINE-EPINEPHRINE 1 %-1:100000 IJ SOLN
INTRAMUSCULAR | Status: DC | PRN
  Administered 2020-08-15: 20 mL

## 2020-08-15 MED ORDER — OXYCODONE HCL 5 MG PO TABS: 5.0000 mg | ORAL_TABLET | ORAL | Status: DC | PRN

## 2020-08-15 MED ORDER — BUPIVACAINE HCL (PF) 0.25 % IJ SOLN
INTRAMUSCULAR | Status: DC | PRN
  Administered 2020-08-15: 30 mL

## 2020-08-15 MED ORDER — SODIUM CHLORIDE 0.9% FLUSH: 3.0000 mL | INTRAVENOUS | Status: DC | PRN

## 2020-08-15 MED ORDER — LIDOCAINE 2% (20 MG/ML) 5 ML SYRINGE
INTRAMUSCULAR | Status: AC
  Filled 2020-08-15: qty 5

## 2020-08-15 MED ORDER — LACTATED RINGERS IV SOLN: INTRAVENOUS | Status: DC

## 2020-08-15 MED ORDER — MIDAZOLAM HCL 5 MG/5ML IJ SOLN
INTRAMUSCULAR | Status: DC | PRN
  Administered 2020-08-15: 2 mg via INTRAVENOUS

## 2020-08-15 MED ORDER — PHENYLEPHRINE HCL (PRESSORS) 10 MG/ML IV SOLN
INTRAVENOUS | Status: AC
  Filled 2020-08-15: qty 1

## 2020-08-15 MED ORDER — EPHEDRINE SULFATE 50 MG/ML IJ SOLN
INTRAMUSCULAR | Status: DC | PRN
  Administered 2020-08-15 (×3): 10 mg via INTRAVENOUS

## 2020-08-15 MED ORDER — CHLORHEXIDINE GLUCONATE CLOTH 2 % EX PADS: 6.0000 | MEDICATED_PAD | Freq: Once | CUTANEOUS | Status: DC

## 2020-08-15 MED ORDER — MIDAZOLAM HCL 2 MG/2ML IJ SOLN
INTRAMUSCULAR | Status: AC
  Filled 2020-08-15: qty 2

## 2020-08-15 MED ORDER — PROMETHAZINE HCL 25 MG/ML IJ SOLN: 6.2500 mg | INTRAMUSCULAR | Status: DC | PRN

## 2020-08-15 MED ORDER — CEFAZOLIN SODIUM-DEXTROSE 2-4 GM/100ML-% IV SOLN
2.0000 g | INTRAVENOUS | Status: AC
  Administered 2020-08-15: 2 g via INTRAVENOUS

## 2020-08-15 MED ORDER — FENTANYL CITRATE (PF) 100 MCG/2ML IJ SOLN
INTRAMUSCULAR | Status: DC | PRN
  Administered 2020-08-15 (×2): 50 ug via INTRAVENOUS
  Administered 2020-08-15: 25 ug via INTRAVENOUS

## 2020-08-15 MED ORDER — SUCCINYLCHOLINE CHLORIDE 20 MG/ML IJ SOLN
INTRAMUSCULAR | Status: DC | PRN
  Administered 2020-08-15: 100 mg via INTRAVENOUS

## 2020-08-15 MED ORDER — ACETAMINOPHEN 325 MG RE SUPP: 650.0000 mg | RECTAL | Status: DC | PRN

## 2020-08-15 MED ORDER — CEFAZOLIN SODIUM-DEXTROSE 2-4 GM/100ML-% IV SOLN
INTRAVENOUS | Status: AC
  Filled 2020-08-15: qty 100

## 2020-08-15 MED ORDER — LIDOCAINE 2% (20 MG/ML) 5 ML SYRINGE
INTRAMUSCULAR | Status: DC | PRN
  Administered 2020-08-15: 80 mg via INTRAVENOUS

## 2020-08-15 MED ORDER — ACETAMINOPHEN 500 MG PO TABS
ORAL_TABLET | ORAL | Status: AC
  Filled 2020-08-15: qty 2

## 2020-08-15 MED ORDER — ONDANSETRON HCL 4 MG/2ML IJ SOLN
INTRAMUSCULAR | Status: AC
  Filled 2020-08-15: qty 2

## 2020-08-15 MED ORDER — DEXAMETHASONE SODIUM PHOSPHATE 10 MG/ML IJ SOLN
INTRAMUSCULAR | Status: AC
  Filled 2020-08-15: qty 1

## 2020-08-15 MED ORDER — SODIUM CHLORIDE 0.9% FLUSH: 3.0000 mL | Freq: Two times a day (BID) | INTRAVENOUS | Status: DC

## 2020-08-15 MED ORDER — PHENYLEPHRINE HCL (PRESSORS) 10 MG/ML IV SOLN
INTRAVENOUS | Status: DC | PRN
  Administered 2020-08-15: 120 ug via INTRAVENOUS
  Administered 2020-08-15 (×2): 80 ug via INTRAVENOUS
  Administered 2020-08-15: 40 ug via INTRAVENOUS
  Administered 2020-08-15: 80 ug via INTRAVENOUS

## 2020-08-15 SURGICAL SUPPLY — 67 items
BAG DECANTER FOR FLEXI CONT (MISCELLANEOUS) ×3 IMPLANT
BINDER BREAST LRG (GAUZE/BANDAGES/DRESSINGS) IMPLANT
BINDER BREAST MEDIUM (GAUZE/BANDAGES/DRESSINGS) IMPLANT
BINDER BREAST XLRG (GAUZE/BANDAGES/DRESSINGS) ×3 IMPLANT
BINDER BREAST XXLRG (GAUZE/BANDAGES/DRESSINGS) IMPLANT
BIOPATCH RED 1 DISK 7.0 (GAUZE/BANDAGES/DRESSINGS) IMPLANT
BIOPATCH RED 1IN DISK 7.0MM (GAUZE/BANDAGES/DRESSINGS)
BLADE HEX COATED 2.75 (ELECTRODE) ×3 IMPLANT
BLADE KNIFE PERSONA 10 (BLADE) ×6 IMPLANT
BLADE SURG 15 STRL LF DISP TIS (BLADE) IMPLANT
BLADE SURG 15 STRL SS (BLADE)
BNDG GAUZE ELAST 4 BULKY (GAUZE/BANDAGES/DRESSINGS) IMPLANT
CANISTER SUCT 1200ML W/VALVE (MISCELLANEOUS) ×3 IMPLANT
COVER BACK TABLE 60X90IN (DRAPES) ×3 IMPLANT
COVER MAYO STAND STRL (DRAPES) ×3 IMPLANT
COVER WAND RF STERILE (DRAPES) IMPLANT
DECANTER SPIKE VIAL GLASS SM (MISCELLANEOUS) IMPLANT
DERMABOND ADVANCED (GAUZE/BANDAGES/DRESSINGS) ×6
DERMABOND ADVANCED .7 DNX12 (GAUZE/BANDAGES/DRESSINGS) ×3 IMPLANT
DRAIN CHANNEL 19F RND (DRAIN) IMPLANT
DRAPE LAPAROSCOPIC ABDOMINAL (DRAPES) ×3 IMPLANT
DRSG OPSITE POSTOP 4X6 (GAUZE/BANDAGES/DRESSINGS) ×3 IMPLANT
DRSG PAD ABDOMINAL 8X10 ST (GAUZE/BANDAGES/DRESSINGS) ×6 IMPLANT
ELECT BLADE 4.0 EZ CLEAN MEGAD (MISCELLANEOUS)
ELECT REM PT RETURN 9FT ADLT (ELECTROSURGICAL) ×3
ELECTRODE BLDE 4.0 EZ CLN MEGD (MISCELLANEOUS) IMPLANT
ELECTRODE REM PT RTRN 9FT ADLT (ELECTROSURGICAL) ×1 IMPLANT
EVACUATOR SILICONE 100CC (DRAIN) IMPLANT
GLOVE BIO SURGEON STRL SZ 6.5 (GLOVE) ×4 IMPLANT
GLOVE BIO SURGEONS STRL SZ 6.5 (GLOVE) ×2
GOWN STRL REUS W/ TWL LRG LVL3 (GOWN DISPOSABLE) ×2 IMPLANT
GOWN STRL REUS W/TWL LRG LVL3 (GOWN DISPOSABLE) ×6
NDL SAFETY ECLIPSE 18X1.5 (NEEDLE) IMPLANT
NEEDLE HYPO 18GX1.5 SHARP (NEEDLE)
NEEDLE HYPO 25X1 1.5 SAFETY (NEEDLE) ×3 IMPLANT
NS IRRIG 1000ML POUR BTL (IV SOLUTION) ×3 IMPLANT
PACK BASIN DAY SURGERY FS (CUSTOM PROCEDURE TRAY) ×3 IMPLANT
PAD ALCOHOL SWAB (MISCELLANEOUS) IMPLANT
PAD FOAM SILICONE BACKED (GAUZE/BANDAGES/DRESSINGS) IMPLANT
PENCIL SMOKE EVACUATOR (MISCELLANEOUS) ×3 IMPLANT
SLEEVE SCD COMPRESS KNEE MED (MISCELLANEOUS) ×3 IMPLANT
SPONGE LAP 18X18 RF (DISPOSABLE) ×6 IMPLANT
STRIP SUTURE WOUND CLOSURE 1/2 (MISCELLANEOUS) ×6 IMPLANT
SUT MNCRL AB 4-0 PS2 18 (SUTURE) ×30 IMPLANT
SUT MON AB 3-0 SH 27 (SUTURE) ×24
SUT MON AB 3-0 SH27 (SUTURE) ×8 IMPLANT
SUT MON AB 5-0 PS2 18 (SUTURE) ×6 IMPLANT
SUT PDS 3-0 CT2 (SUTURE) ×9
SUT PDS AB 2-0 CT2 27 (SUTURE) IMPLANT
SUT PDS II 3-0 CT2 27 ABS (SUTURE) ×3 IMPLANT
SUT SILK 3 0 PS 1 (SUTURE) IMPLANT
SUT VIC AB 3-0 SH 27 (SUTURE)
SUT VIC AB 3-0 SH 27X BRD (SUTURE) IMPLANT
SUT VICRYL 4-0 PS2 18IN ABS (SUTURE) IMPLANT
SYR 3ML 23GX1 SAFETY (SYRINGE) ×3 IMPLANT
SYR 50ML LL SCALE MARK (SYRINGE) IMPLANT
SYR BULB IRRIG 60ML STRL (SYRINGE) ×3 IMPLANT
SYR CONTROL 10ML LL (SYRINGE) ×3 IMPLANT
TAPE MEASURE VINYL STERILE (MISCELLANEOUS) IMPLANT
TOWEL GREEN STERILE FF (TOWEL DISPOSABLE) ×6 IMPLANT
TRAY DSU PREP LF (CUSTOM PROCEDURE TRAY) ×3 IMPLANT
TUBE CONNECTING 20'X1/4 (TUBING) ×1
TUBE CONNECTING 20X1/4 (TUBING) ×2 IMPLANT
TUBING INFILTRATION IT-10001 (TUBING) IMPLANT
TUBING SET GRADUATE ASPIR 12FT (MISCELLANEOUS) IMPLANT
UNDERPAD 30X36 HEAVY ABSORB (UNDERPADS AND DIAPERS) ×6 IMPLANT
YANKAUER SUCT BULB TIP NO VENT (SUCTIONS) ×3 IMPLANT

## 2020-08-15 NOTE — Anesthesia Procedure Notes (Signed)
Procedure Name: Intubation Date/Time: 08/15/2020 10:07 AM Performed by: Lavonia Dana, CRNA Pre-anesthesia Checklist: Patient identified, Emergency Drugs available, Suction available and Patient being monitored Patient Re-evaluated:Patient Re-evaluated prior to induction Oxygen Delivery Method: Circle system utilized Preoxygenation: Pre-oxygenation with 100% oxygen Induction Type: IV induction Ventilation: Mask ventilation without difficulty Laryngoscope Size: Mac and 3 Grade View: Grade I Tube type: Oral Tube size: 7.0 mm Number of attempts: 1 Airway Equipment and Method: Stylet and Bite block Placement Confirmation: ETT inserted through vocal cords under direct vision,  positive ETCO2 and breath sounds checked- equal and bilateral Secured at: 23 cm Tube secured with: Tape Dental Injury: Teeth and Oropharynx as per pre-operative assessment

## 2020-08-15 NOTE — Anesthesia Postprocedure Evaluation (Signed)
Anesthesia Post Note  Patient: Tracy Tran  Procedure(s) Performed: BREAST REDUCTION BILATERAL (Bilateral Breast)     Patient location during evaluation: PACU Anesthesia Type: General Level of consciousness: awake and alert Pain management: pain level controlled Vital Signs Assessment: post-procedure vital signs reviewed and stable Respiratory status: spontaneous breathing, nonlabored ventilation and respiratory function stable Cardiovascular status: blood pressure returned to baseline and stable Postop Assessment: no apparent nausea or vomiting Anesthetic complications: no   No complications documented.  Last Vitals:  Vitals:   08/15/20 1249 08/15/20 1327  BP:  (!) 143/82  Pulse: 86 82  Resp: 17 16  Temp:  36.9 C  SpO2: 99% 99%    Last Pain:  Vitals:   08/15/20 1313  TempSrc:   PainSc: 5                  Cecile Hearing

## 2020-08-15 NOTE — Anesthesia Procedure Notes (Signed)
Procedure Name: LMA Insertion Date/Time: 08/15/2020 10:03 AM Performed by: Lauralyn Primes, CRNA Pre-anesthesia Checklist: Patient identified, Emergency Drugs available, Suction available and Patient being monitored Patient Re-evaluated:Patient Re-evaluated prior to induction Oxygen Delivery Method: Circle system utilized Preoxygenation: Pre-oxygenation with 100% oxygen Induction Type: IV induction Ventilation: Mask ventilation without difficulty LMA: LMA inserted LMA Size: 4.0 Number of attempts: 1 Airway Equipment and Method: Bite block Placement Confirmation: positive ETCO2 Tube secured with: Tape Dental Injury: Teeth and Oropharynx as per pre-operative assessment

## 2020-08-15 NOTE — Transfer of Care (Signed)
Immediate Anesthesia Transfer of Care Note  Patient: Tracy Tran  Procedure(s) Performed: BREAST REDUCTION BILATERAL (Bilateral Breast)  Patient Location: PACU  Anesthesia Type:General  Level of Consciousness: drowsy  Airway & Oxygen Therapy: Patient Spontanous Breathing and Patient connected to face mask oxygen  Post-op Assessment: Report given to RN and Post -op Vital signs reviewed and stable  Post vital signs: Reviewed and stable  Last Vitals:  Vitals Value Taken Time  BP 132/86 08/15/20 1219  Temp    Pulse 106 08/15/20 1222  Resp 17 08/15/20 1222  SpO2 100 % 08/15/20 1222  Vitals shown include unvalidated device data.  Last Pain:  Vitals:   08/15/20 0852  TempSrc: Oral  PainSc: 0-No pain      Patients Stated Pain Goal: 5 (08/15/20 4076)  Complications: No complications documented.

## 2020-08-15 NOTE — Anesthesia Preprocedure Evaluation (Addendum)
Anesthesia Evaluation  Patient identified by MRN, date of birth, ID band Patient awake    Reviewed: Allergy & Precautions, NPO status , Patient's Chart, lab work & pertinent test results  Airway Mallampati: II  TM Distance: >3 FB Neck ROM: Full    Dental  (+) Teeth Intact Upper and lower braces :   Pulmonary former smoker,    Pulmonary exam normal breath sounds clear to auscultation       Cardiovascular negative cardio ROS Normal cardiovascular exam Rhythm:Regular Rate:Normal     Neuro/Psych PSYCHIATRIC DISORDERS Anxiety negative neurological ROS     GI/Hepatic Neg liver ROS, GERD  Medicated,  Endo/Other  Hypothyroidism   Renal/GU negative Renal ROS  Female genitourinary complaint:       Musculoskeletal Raynaud disease   Abdominal   Peds  Hematology negative hematology ROS (+)   Anesthesia Other Findings Day of surgery medications reviewed with the patient.  Mammary hypertrophy  Reproductive/Obstetrics                            Anesthesia Physical Anesthesia Plan  ASA: II  Anesthesia Plan: General   Post-op Pain Management:    Induction: Intravenous  PONV Risk Score and Plan: 3 and Midazolam, Dexamethasone and Ondansetron  Airway Management Planned: Oral ETT  Additional Equipment:   Intra-op Plan:   Post-operative Plan: Extubation in OR  Informed Consent: I have reviewed the patients History and Physical, chart, labs and discussed the procedure including the risks, benefits and alternatives for the proposed anesthesia with the patient or authorized representative who has indicated his/her understanding and acceptance.       Plan Discussed with: CRNA  Anesthesia Plan Comments:         Anesthesia Quick Evaluation

## 2020-08-15 NOTE — Op Note (Addendum)
Breast Reduction Op note:    DATE OF PROCEDURE: 08/15/2020  LOCATION: [facility]  SURGEON: Alan Ripper Sanger Shiryl Ruddy, DO  ASSISTANT: Keenan Bachelor, PA  PREOPERATIVE DIAGNOSIS 1. Macromastia 2. Neck Pain 3. Back Pain  POSTOPERATIVE DIAGNOSIS 1. Macromastia 2. Neck Pain 3. Back Pain  PROCEDURES 1. Bilateral breast reduction.  Right reduction 823 g, Left reduction 823 g  COMPLICATIONS: None.  DRAINS: none  INDICATIONS FOR PROCEDURE Tracy Tran is a 39 y.o. year-old female born on 1981/05/22,with a history of symptomatic macromastia with concominant back pain, neck pain, shoulder grooving from her bra.   MRN: [id number]  CONSENT Informed consent was obtained directly from the patient. The risks, benefits and alternatives were fully discussed. Specific risks including but not limited to bleeding, infection, hematoma, seroma, scarring, pain, nipple necrosis, asymmetry, poor cosmetic results, and need for further surgery were discussed. The patient had ample opportunity to have her questions answered to her satisfaction.  DESCRIPTION OF PROCEDURE  Patient was brought into the operating room and placed in a supine position.  SCDs were placed and appropriate padding was performed.  Antibiotics were given. The patient underwent general anesthesia and the chest was prepped and draped in a sterile fashion.  A timeout was performed and all information was confirmed to be correct.  Right side: Preoperative markings were confirmed.  Incision lines were injected with local with epinephrine.  After waiting for vasoconstriction, the marked lines were incised.  A Wise-pattern superomedial breast reduction was performed by de-epithelializing the pedicle, using bovie to create the superomedial pedicle, and removing breast tissue from the lateral and inferior portions of the breast.  Care was taken to not undermine the breast pedicle. Hemostasis was achieved.  The nipple  was gently rotated into position and the soft tissue closed with 4-0 Monocryl.   The pocket was irrigated and hemostasis confirmed.  The deep tissues were approximated with 3-0 PDS and Monocryl sutures and the skin was closed with deep dermal and subcuticular 4-0 Monocryl sutures.  The nipple and skin flaps had good capillary refill at the end of the procedure.    Left side: Preoperative markings were confirmed.  Incision lines were injected with local with epinephrine.  After waiting for vasoconstriction, the marked lines were incised.  A Wise-pattern superomedial breast reduction was performed by de-epithelializing the pedicle, using bovie to create the superomedial pedicle, and removing breast tissue from the lateral and inferior portions of the breast.  Care was taken to not undermine the breast pedicle. Hemostasis was achieved.  The nipple was gently rotated into position and the soft tissue was closed with 4-0 Monocryl.  The patient was sat upright and size and shape symmetry was confirmed.  The pocket was irrigated and hemostasis confirmed.  The deep tissues were approximated with 3-0 PDS and Monocryl sutures and the skin was closed with deep dermal and subcuticular 4-0 Monocryl sutures.  Dermabond was applied.  A breast binder and ABDs were placed.  The nipple and skin flaps had good capillary refill at the end of the procedure.  The patient tolerated the procedure well. The patient was allowed to wake from anesthesia and taken to the recovery room in satisfactory condition.  The advanced practice practitioner (APP) assisted throughout the case.  The APP was essential in retraction and counter traction when needed to make the case progress smoothly.  This retraction and assistance made it possible to see the tissue plans for the procedure.  The assistance was needed for  blood control, tissue re-approximation and assisted with closure of the incision site.

## 2020-08-15 NOTE — Discharge Instructions (Signed)
INSTRUCTIONS FOR AFTER SURGERY   You will likely have some questions about what to expect following your operation.  The following information will help you and your family understand what to expect when you are discharged from the hospital.  Following these guidelines will help ensure a smooth recovery and reduce risks of complications.  Postoperative instructions include information on: diet, wound care, medications and physical activity.  AFTER SURGERY Expect to go home after the procedure.  In some cases, you may need to spend one night in the hospital for observation.  DIET This surgery does not require a specific diet.  However, I have to mention that the healthier you eat the better your body can start healing. It is important to increasing your protein intake.  This means limiting the foods with added sugar.  Focus on fruits and vegetables and some meat. It is very important to drink water after your surgery.  If your urine is bright yellow, then it is concentrated, and you need to drink more water.  As a general rule after surgery, you should have 8 ounces of water every hour while awake.  If you find you are persistently nauseated or unable to take in liquids let us know.  NO TOBACCO USE or EXPOSURE.  This will slow your healing process and increase the risk of a wound.   Post Anesthesia Home Care Instructions  Activity: Get plenty of rest for the remainder of the day. A responsible individual must stay with you for 24 hours following the procedure.  For the next 24 hours, DO NOT: -Drive a car -Advertising copywriter -Drink alcoholic beverages -Take any medication unless instructed by your physician -Make any legal decisions or sign important papers.  Meals: Start with liquid foods such as gelatin or soup. Progress to regular foods as tolerated. Avoid greasy, spicy, heavy foods. If nausea and/or vomiting occur, drink only clear liquids until the nausea and/or vomiting subsides. Call  your physician if vomiting continues.  Special Instructions/Symptoms: Your throat may feel dry or sore from the anesthesia or the breathing tube placed in your throat during surgery. If this causes discomfort, gargle with warm salt water. The discomfort should disappear within 24 hours.  If you had a scopolamine patch placed behind your ear for the management of post- operative nausea and/or vomiting:  1. The medication in the patch is effective for 72 hours, after which it should be removed.  Wrap patch in a tissue and discard in the trash. Wash hands thoroughly with soap and water. 2. You may remove the patch earlier than 72 hours if you experience unpleasant side effects which may include dry mouth, dizziness or visual disturbances. 3. Avoid touching the patch. Wash your hands with soap and water after contact with the patch.      WOUND CARE If you don't have a drain: You can shower the day after surgery.  Use fragrance free soap.  Dial, Dove, [place] and Cetaphil are usually mild on the skin.    If you have steri-strips / tape directly attached to your skin leave them in place. It is OK to get these wet.  No baths, pools or hot tubs for two weeks. We close your incision to leave the smallest and best-looking scar. No ointment or creams on your incisions until given the go ahead.  Especially not Neosporin (Too many skin reactions with this one).  A few weeks after surgery you can use Mederma and start massaging the scar. We ask  you to wear your binder or sports bra for the first 6 weeks around the clock, including while sleeping. This provides added comfort and helps reduce the fluid accumulation at the surgery site.  ACTIVITY No heavy lifting until cleared by the doctor.  It is OK to walk and climb stairs. In fact, moving your legs is very important to decrease your risk of a blood clot.  It will also help keep you from getting deconditioned.  Every 1 to 2 hours get up and walk for 5 minutes.  This will help with a quicker recovery back to normal.  Let pain be your guide so you don't do too much.  NO, you cannot do the spring cleaning and don't plan on taking care of anyone else.  This is your time for TLC.   WORK Everyone returns to work at different times. As a rough guide, most people take at least 1 - 2 weeks off prior to returning to work. If you need documentation for your job, bring the forms to your postoperative follow up visit.  DRIVING Arrange for someone to bring you home from the hospital.  You may be able to drive a few days after surgery but not while taking any narcotics or valium.  BOWEL MOVEMENTS Constipation can occur after anesthesia and while taking pain medication.  It is important to stay ahead for your comfort.  We recommend taking Milk of Magnesia (2 tablespoons; twice a day) while taking the pain pills.  SEROMA This is fluid your body tried to put in the surgical site.  This is normal but if it creates excessive pain and swelling let us know.  It usually decreases in a few weeks.  MEDICATIONS and PAIN CONTROL At your preoperative visit for you history and physical you were given the following medications: 1. An antibiotic: Start this medication when you get home and take according to the instructions on the bottle. 2. Zofran 4 mg:  This is to treat nausea and vomiting.  You can take this every 6 hours as needed and only if needed. 3. Norco (hydrocodone/acetaminophen) 5/325 mg:  This is only to be used after you have taken the motrin or the tylenol. Every 8 hours as needed. Over the counter Medication to take: 4. Ibuprofen (Motrin) 600 mg:  Take this every 6 hours.  If you have additional pain then take 500 mg of the tylenol.  Only take the Norco after you have tried these two. 5. Miralax or stool softener of choice: Take this according to the bottle if you take the Norco.  WHEN TO CALL Call your surgeon's office if any of the following occur: . Fever 101  degrees F or greater . Excessive bleeding or fluid from the incision site. . Pain that increases over time without aid from the medications . Redness, warmth, or pus draining from incision sites . Persistent nausea or inability to take in liquids . Severe misshapen area that underwent the operation.   No tylenol until after 3pm today if needed.

## 2020-08-15 NOTE — Interval H&P Note (Signed)
History and Physical Interval Note:  08/15/2020 9:32 AM  Tracy Tran  has presented today for surgery, with the diagnosis of mammary hypertrophy.  The various methods of treatment have been discussed with the patient and family. After consideration of risks, benefits and other options for treatment, the patient has consented to  Procedure(s) with comments: BREAST REDUCTION WITH LIPOSUCTION (Bilateral) - 3.5 hours as a surgical intervention.  The patient's history has been reviewed, patient examined, no change in status, stable for surgery.  I have reviewed the patient's chart and labs.  Questions were answered to the patient's satisfaction.     Alena Bills Tanaysia Bhardwaj

## 2020-08-16 ENCOUNTER — Encounter: Payer: Self-pay | Admitting: Plastic Surgery

## 2020-08-16 MED ORDER — NITROGLYCERIN 0.4 % RE OINT: 1.0000 [in_us] | TOPICAL_OINTMENT | Freq: Two times a day (BID) | RECTAL | 0 refills | Status: DC

## 2020-08-17 LAB — SURGICAL PATHOLOGY

## 2020-08-20 ENCOUNTER — Encounter (HOSPITAL_BASED_OUTPATIENT_CLINIC_OR_DEPARTMENT_OTHER): Payer: Self-pay | Admitting: Plastic Surgery

## 2020-08-21 ENCOUNTER — Ambulatory Visit (INDEPENDENT_AMBULATORY_CARE_PROVIDER_SITE_OTHER): Payer: No Typology Code available for payment source | Admitting: Plastic Surgery

## 2020-08-21 ENCOUNTER — Encounter: Payer: Self-pay | Admitting: Plastic Surgery

## 2020-08-21 ENCOUNTER — Other Ambulatory Visit: Payer: Self-pay

## 2020-08-21 VITALS — BP 114/80 | HR 91 | Temp 98.6°F

## 2020-08-21 DIAGNOSIS — N62 Hypertrophy of breast: Secondary | ICD-10-CM

## 2020-08-21 NOTE — Progress Notes (Signed)
The patient is a 39 year old female who underwent a bilateral breast reduction.  She came in because she was concerned about some discoloration of the left nipple areola.  She called over the weekend and I sent in some Nitropaste.  She was unable to get it.  She did some warm compresses.  It looks alive and has good cap refill.  It does look bruised and there is some discoloration around the periphery of the areola likely from the Dermabond.  Continue with the breast binder and follow-up in 1 week.  We will leave the honeycomb dressing in place.

## 2020-08-30 ENCOUNTER — Other Ambulatory Visit (HOSPITAL_COMMUNITY): Payer: Self-pay | Admitting: Endocrinology

## 2020-08-30 MED FILL — LEVOTHYROXINE 50 MCG TABLET: 50 | 30 days supply | Qty: 30 | Fill #0

## 2020-08-31 ENCOUNTER — Other Ambulatory Visit: Payer: Self-pay

## 2020-08-31 ENCOUNTER — Ambulatory Visit (INDEPENDENT_AMBULATORY_CARE_PROVIDER_SITE_OTHER): Payer: No Typology Code available for payment source | Admitting: Surgical

## 2020-08-31 ENCOUNTER — Encounter: Payer: Self-pay | Admitting: Surgical

## 2020-08-31 VITALS — HR 77 | Temp 98.7°F

## 2020-08-31 DIAGNOSIS — Z9889 Other specified postprocedural states: Secondary | ICD-10-CM

## 2020-08-31 NOTE — Progress Notes (Signed)
Patient is a 39 year old female here for follow-up after bilateral breast reduction with Dr. Ulice Bold on 08/15/2020.  At her last visit there was some concern about discoloration of the left nipple areola, at that time it appeared viable.   Patient reports today that she is doing really well.  She reports a little bit of swelling under her arms but no fevers chills nausea or vomiting.  She is very happy with the size of her breast and she reports immediate relief of her neck and back pain.  Chaperone present on exam On exam bilateral NAC's are viable with good color.  No necrosis noted.  Bilateral breast incisions intact.  Steri-Strips in place along incisions.  No erythema noted.  No fluid wave noted with palpation.  Some swelling noted bilaterally.  No bruising noted.  Recommend continue her compressive garment 24/7 for a total of 6 weeks postoperatively.  Avoid strenuous activity for a total of 6 weeks postoperatively. There is no sign of infection, seroma, hematoma. Call with questions or concerns, follow-up in 2 weeks for reevaluation.

## 2020-09-03 MED FILL — SERTRALINE HCL 50 MG TABLET: 50 | 90 days supply | Qty: 135 | Fill #1

## 2020-09-03 MED FILL — ATORVASTATIN CALCIUM 20 MG: 20 | 90 days supply | Qty: 90 | Fill #1

## 2020-09-12 DIAGNOSIS — Z9889 Other specified postprocedural states: Secondary | ICD-10-CM | POA: Insufficient documentation

## 2020-09-12 NOTE — Progress Notes (Signed)
Patient is a 39 year old female here for follow-up after undergoing bilateral breast reduction with Dr. Ulice Bold on 08/15/2020.  ~ 4 weeks PO Patient reports she is doing very well overall.  Reports a slight odor from underneath her breasts.  Denies fever/chills, nausea/vomiting.  Few Steri-Strips remain along the vertical limb.  These were removed today.  Bilateral incisions are healing very nicely, C/D/I.  No signs of infection, redness, drainage, seroma/hematoma.  A few suture knots were removed today.  Some  Dermabond still remains.  Continue to wear compression garment 24/7 for 2 more weeks and then may wear it only during the day if desired.  Continue to avoid heavy lifting and vigorous activity for 2 more weeks and then may increase gradually as tolerated.  May shower normally.  Follow-up in 4 to 6 weeks.  Call office with any questions/concerns.  Return precautions provided.

## 2020-09-13 ENCOUNTER — Other Ambulatory Visit: Payer: Self-pay

## 2020-09-13 ENCOUNTER — Encounter: Payer: Self-pay | Admitting: Plastic Surgery

## 2020-09-13 ENCOUNTER — Ambulatory Visit (INDEPENDENT_AMBULATORY_CARE_PROVIDER_SITE_OTHER): Payer: No Typology Code available for payment source | Admitting: Plastic Surgery

## 2020-09-13 VITALS — BP 112/77 | HR 69 | Temp 98.0°F

## 2020-09-13 DIAGNOSIS — Z9889 Other specified postprocedural states: Secondary | ICD-10-CM

## 2020-10-10 ENCOUNTER — Telehealth: Payer: No Typology Code available for payment source | Admitting: Family

## 2020-10-10 ENCOUNTER — Other Ambulatory Visit (HOSPITAL_COMMUNITY): Payer: Self-pay | Admitting: Family

## 2020-10-10 DIAGNOSIS — U071 COVID-19: Secondary | ICD-10-CM

## 2020-10-10 MED ORDER — FLUTICASONE PROPIONATE 50 MCG/ACT NA SUSP: 2.0000 | Freq: Every day | NASAL | 6 refills | Status: DC

## 2020-10-10 MED ORDER — DEXAMETHASONE 6 MG PO TABS: 6.0000 mg | ORAL_TABLET | Freq: Two times a day (BID) | ORAL | 0 refills | Status: DC

## 2020-10-10 MED ORDER — ALBUTEROL SULFATE HFA 108 (90 BASE) MCG/ACT IN AERS: 2.0000 | INHALATION_SPRAY | Freq: Four times a day (QID) | RESPIRATORY_TRACT | 0 refills | Status: DC | PRN

## 2020-10-10 MED ORDER — BENZONATATE 100 MG PO CAPS: 100.0000 mg | ORAL_CAPSULE | Freq: Three times a day (TID) | ORAL | 0 refills | Status: DC | PRN

## 2020-10-10 MED FILL — DEXAMETHASONE 2 MG TABLET: 2 | 7 days supply | Qty: 42 | Fill #0

## 2020-10-10 MED FILL — ALBUTEROL SULFATE HFA 108 (: 108 (90 BAS | 25 days supply | Qty: 9 | Fill #0

## 2020-10-10 NOTE — Progress Notes (Signed)
E-Visit for Corona Virus Screening  We are sorry you are not feeling well. We are here to help!  You have tested positive for COVID-19, meaning that you were infected with the novel coronavirus and could give the virus to others.  It is vitally important that you stay home so you do not spread it to others.      Please continue isolation at home, for at least 10 days since the start of your symptoms and until you have had 24 hours with no fever (without taking a fever reducer) and with improving of symptoms.  If you have no symptoms but tested positive (or all symptoms resolve after 5 days and you have no fever) you can leave your house but continue to wear a mask around others for an additional 5 days. If you have a fever,continue to stay home until you have had 24 hours of no fever. Most cases improve 5-10 days from onset but we have seen a small number of patients who have gotten worse after the 10 days.  Please be sure to watch for worsening symptoms and remain taking the proper precautions.   Go to the nearest hospital ED for assessment if fever/cough/breathlessness are severe or illness seems like a threat to life.    The following symptoms may appear 2-14 days after exposure: . Fever . Cough . Shortness of breath or difficulty breathing . Chills . Repeated shaking with chills . Muscle pain . Headache . Sore throat . New loss of taste or smell . Fatigue . Congestion or runny nose . Nausea or vomiting . Diarrhea  You have been enrolled in MyChart Home Monitoring for COVID-19. Daily you will receive a questionnaire within the MyChart website. Our COVID-19 response team will be monitoring your responses daily.  You can use medication such as A prescription cough medication called Tessalon Perles 100 mg. You may take 1-2 capsules every 8 hours as needed for cough, A prescription inhaler called Albuterol MDI 90 mcg /actuation 2 puffs every 4 hours as needed for shortness of breath,  wheezing, cough and A prescription for Fluticasone nasal spray 2 sprays in each nostril one time per day and dexamethasone  6 mg twice a day for 7 days.   We have asked the monoclonal antibody team contact you to discuss the infusion with you and potentially set this up. You should hear from them in the next 48 hours.    You may also take acetaminophen (Tylenol) as needed for fever.  HOME CARE: . Only take medications as instructed by your medical team. . Drink plenty of fluids and get plenty of rest. . A steam or ultrasonic humidifier can help if you have congestion.   GET HELP RIGHT AWAY IF YOU HAVE EMERGENCY WARNING SIGNS.  Call 911 or proceed to your closest emergency facility if: . You develop worsening high fever. . Trouble breathing . Bluish lips or face . Persistent pain or pressure in the chest . New confusion . Inability to wake or stay awake . You cough up blood. . Your symptoms become more severe . Inability to hold down food or fluids  This list is not all possible symptoms. Contact your medical provider for any symptoms that are severe or concerning to you.    Your e-visit answers were reviewed by a board certified advanced clinical practitioner to complete your personal care plan.  Depending on the condition, your plan could have included both over the counter or prescription medications.  If   there is a problem please reply once you have received a response from your provider.  Your safety is important to us.  If you have drug allergies check your prescription carefully.    You can use MyChart to ask questions about today's visit, request a non-urgent call back, or ask for a work or school excuse for 24 hours related to this e-Visit. If it has been greater than 24 hours you will need to follow up with your provider, or enter a new e-Visit to address those concerns. You will get an e-mail in the next two days asking about your experience.  I hope that your e-visit has  been valuable and will speed your recovery. Thank you for using e-visits.    Approximately 5 minutes was spent documenting and reviewing patient's chart.     

## 2020-10-11 ENCOUNTER — Other Ambulatory Visit (HOSPITAL_COMMUNITY): Payer: Self-pay | Admitting: Internal Medicine

## 2020-10-11 ENCOUNTER — Encounter (HOSPITAL_COMMUNITY): Payer: Self-pay

## 2020-10-11 ENCOUNTER — Telehealth (HOSPITAL_COMMUNITY): Payer: Self-pay | Admitting: Pharmacist

## 2020-10-11 DIAGNOSIS — U071 COVID-19: Secondary | ICD-10-CM

## 2020-10-11 MED ORDER — MOLNUPIRAVIR 200 MG PO CAPS: 800.0000 mg | ORAL_CAPSULE | Freq: Two times a day (BID) | ORAL | 0 refills | Status: DC

## 2020-10-11 MED FILL — MOLNUPIRAVIR 200 MG CAPS: 200 | 5 days supply | Qty: 40 | Fill #0

## 2020-10-11 MED FILL — LEVOTHYROXINE 50 MCG TABLET: 50 | 60 days supply | Qty: 60 | Fill #1

## 2020-10-11 NOTE — Telephone Encounter (Signed)
Patient was prescribed oral covid treatment molnupiravir and treatment note was reviewed. Medication has been received by [facility] and reviewed for appropriateness.  Drug Interactions or Dosage Adjustments Noted: None  Delivery Method: Patient picked up Patient contacted for counseling on 10/11/2020 and verbalized understanding.   Delivery or Pick-Up Date:10/11/2020   Larrie Kass 10/11/2020, 1:52 PM [facility] Pharmacist Phone# [phone]

## 2020-10-11 NOTE — Progress Notes (Signed)
Outpatient Oral COVID Treatment Note  I connected with Tracy Tran on 10/11/2020/11:12 AM by telephone and verified that I am speaking with the correct person using two identifiers.  I discussed the limitations, risks, security, and privacy concerns of performing an evaluation and management service by telephone and the availability of in person appointments. I also discussed with the patient that there may be a patient responsible charge related to this service. The patient expressed understanding and agreed to proceed.  Patient location: home Provider location: remote  Diagnosis: COVID-19 infection  Purpose of visit: Discussion of potential use of Molnupiravir or Paxlovid, a new treatment for mild to moderate COVID-19 viral infection in non-hospitalized patients.   Subjective: Patient is a 40 y.o. female who has been diagnosed with COVID 19 viral infection.  Their symptoms began on 1/16 with cough and sneezing.    Past Medical History:  Diagnosis Date  . Anxiety   . GERD (gastroesophageal reflux disease)    TUMS  . Hashimoto's disease 2020  . Hypothyroidism   . Macromastia   . Pregnancy induced hypertension    with previous pregnancy  . Raynaud disease   . Vaginal Pap smear, abnormal     Allergies  Allergen Reactions  . Codeine Other (See Comments)     Current Outpatient Medications:  Marland Kitchen  Molnupiravir 200 MG CAPS, Take 4 capsules (800 mg total) by mouth 2 (two) times daily for 5 days., Disp: 40 capsule, Rfl: 0 .  albuterol (VENTOLIN HFA) 108 (90 Base) MCG/ACT inhaler, Inhale 2 puffs into the lungs every 6 (six) hours as needed for wheezing or shortness of breath., Disp: 8 g, Rfl: 0 .  atorvastatin (LIPITOR) 20 MG tablet, TK 1 T PO QD, Disp: , Rfl:  .  benzonatate (TESSALON PERLES) 100 MG capsule, Take 1 capsule (100 mg total) by mouth 3 (three) times daily as needed., Disp: 20 capsule, Rfl: 0 .  Cholecalciferol (VITAMIN D) 50 MCG (2000 UT) tablet, Take 2,000 Units by mouth  daily., Disp: , Rfl:  .  dexamethasone (DECADRON) 6 MG tablet, Take 1 tablet (6 mg total) by mouth 2 (two) times daily., Disp: 14 tablet, Rfl: 0 .  fluticasone (FLONASE) 50 MCG/ACT nasal spray, Place 2 sprays into both nostrils daily., Disp: 16 g, Rfl: 6 .  levothyroxine (SYNTHROID) 75 MCG tablet, Take 75 mcg by mouth daily., Disp: , Rfl:  .  Multiple Vitamin (MULTIVITAMIN) tablet, Take 1 tablet by mouth daily. (Patient not taking: Reported on 09/13/2020), Disp: , Rfl:  .  Nitroglycerin 0.4 % OINT, Place 1 inch rectally in the morning and at bedtime for 2 days. Apply 1 inch to nipple areola twice a day for 2 days, Disp: 5 g, Rfl: 0 .  sertraline (ZOLOFT) 50 MG tablet, Take 100 mg by mouth daily. , Disp: , Rfl:   Objective: Patient appears/sounds well.  They are in no apparent distress.  Breathing is non labored.  Mood and behavior are normal.  Laboratory Data:  Recent Results (from the past 2160 hour(s))  SARS CORONAVIRUS 2 (TAT 6-24 HRS) Nasopharyngeal Nasopharyngeal Swab     Status: None   Collection Time: 08/13/20  9:24 AM   Specimen: Nasopharyngeal Swab  Result Value Ref Range   SARS Coronavirus 2 NEGATIVE NEGATIVE    Comment: (NOTE) SARS-CoV-2 target nucleic acids are NOT DETECTED.  The SARS-CoV-2 RNA is generally detectable in upper and lower respiratory specimens during the acute phase of infection. Negative results do not preclude SARS-CoV-2 infection, do  not rule out co-infections with other pathogens, and should not be used as the sole basis for treatment or other patient management decisions. Negative results must be combined with clinical observations, patient history, and epidemiological information. The expected result is Negative.  Fact Sheet for Patients: HairSlick.no  Fact Sheet for Healthcare Providers: quierodirigir.com  This test is not yet approved or cleared by the [place] FDA and  has been authorized  for detection and/or diagnosis of SARS-CoV-2 by FDA under an Emergency Use Authorization (EUA). This EUA will remain  in effect (meaning this test can be used) for the duration of the COVID-19 declaration under Se ction 564(b)(1) of the Act, 21 U.S.C. section 360bbb-3(b)(1), unless the authorization is terminated or revoked sooner.  Performed at [facility], [address], [place], [place] [address]   Pregnancy, urine POC     Status: None   Collection Time: 08/15/20  9:32 AM  Result Value Ref Range   Preg Test, Ur NEGATIVE NEGATIVE    Comment:        THE SENSITIVITY OF THIS METHODOLOGY IS >24 mIU/mL   Surgical pathology     Status: None   Collection Time: 08/15/20 10:38 AM  Result Value Ref Range   SURGICAL PATHOLOGY      SURGICAL PATHOLOGY CASE: [id number] PATIENT: Tracy Tran Surgical Pathology Report     Clinical History: mammary hypertrophy (cm)     FINAL MICROSCOPIC DIAGNOSIS:  A. BREAST, RIGHT, MAMMOPLASTY: - Benign breast parenchyma with no specific histopathologic changes. - Negative for carcinoma  B. BREAST, LEFT, MAMMOPLASTY: - Benign breast parenchyma with no specific histopathologic changes. - Negative for carcinoma   GROSS DESCRIPTION:  A: Specimen: Right breast tissue, received fresh.  The specimen is placed in formalin 12:45 PM on 08/15/2020. Weight: 816 g Size in aggregate: 20 x 19 x 5.5 cm including attached and separate portions of skin Cut Surface: The cut surfaces consist of soft yellow adipose tissue and white fibrous tissue.  There are no discrete masses. Block Summary: 2 blocks submitted  B: Specimen: Left breast tissue, received fresh.  The specimen is placed in formalin at 12:45 PM on 08/15/2020. Weight: 820 g Size in aggregate: 20.5 x 1 9 x 5.5 cm including attached and separate portions of skin Cut Surface: The cut surfaces consist soft yellow adipose tissue and white fibrous tissue.  There are no discrete  masses. Block Summary: 2 blocks submitted (GRP 08/15/2020)     Final Diagnosis performed by Holley Bouche, MD.   Electronically signed 08/17/2020 Technical component performed at [facility]. [facility], [address], [place], [place] [address].  Professional component performed at [facility], [address] [place], [place] [address].  Immunohistochemistry Technical component (if applicable) was performed at [facility]. [address], [place], [place] [address].   IMMUNOHISTOCHEMISTRY DISCLAIMER (if applicable): Some of these immunohistochemical stains may have been developed and the performance characteristics determine by [facility]. Some may not have been cleared or approved by the U.S. Food and Drug Administration . The FDA has determined that such clearance or approval is not necessary. This test is used for clinical purposes. It should not be regarded as investigational or for research. This laboratory is certified under the Clinical Laboratory Improvement Amendments of 1988 (CLIA-88) as qualified to perform high complexity clinical laboratory testing.  The controls stained appropriately.      Assessment: 40 y.o. female with mild/moderate COVID 19 viral infection  diagnosed on 1/17 at high risk for progression to severe COVID 19.  Plan:  This patient is a 40 y.o. female that meets the following criteria for Emergency Use Authorization of: Molnupiravir  1. Age >18 yr 2. SARS-COV-2 positive test 3. Symptom onset < 5 days 4. Mild-to-moderate COVID disease with high risk for severe progression to hospitalization or death   I have spoken and communicated the following to the patient or parent/caregiver regarding: 1. Molnupiravir is an unapproved drug that is authorized for use under an TEFL teacher.  2. There are no adequate, approved, available products for the treatment of COVID-19 in  adults who have mild-to-moderate COVID-19 and are at high risk for progressing to severe COVID-19, including hospitalization or death. 3. Other therapeutics are currently authorized. For additional information on all products authorized for treatment or prevention of COVID-19, please see https://www.graham-miller.com/.  4. There are benefits and risks of taking this treatment as outlined in the "Fact Sheet for Patients and Caregivers."  5. "Fact Sheet for Patients and Caregivers" was reviewed with patient. A hard copy will be provided to patient from pharmacy prior to the patient receiving treatment. 6. Patients should continue to self-isolate and use infection control measures (e.g., wear mask, isolate, social distance, avoid sharing personal items, clean and disinfect "high touch" surfaces, and frequent handwashing) according to CDC guidelines.  7. The patient or parent/caregiver has the option to accept or refuse treatment. 8. [employer] has established a pregnancy surveillance program. 9. Females of childbearing potential should use a reliable method of contraception correctly and consistently, as applicable, for the duration of treatment and for 4 days after the last dose of Molnupiravir. 10. Males of reproductive potential who are sexually active with females of childbearing potential should use a reliable method of contraception correctly and consistently during treatment and for at least 3 months after the last dose. 11. Pregnancy status and risk was assessed. Patient verbalized understanding of precautions.   After reviewing above information with the patient, the patient agrees to receive molnupiravir.  Follow up instructions:    . Take prescription BID x 5 days as directed . Reach out to pharmacist for counseling on medication if desired . For concerns regarding further COVID symptoms  please follow up with your PCP or urgent care . For urgent or life-threatening issues, seek care at your local emergency department  The patient was provided an opportunity to ask questions, and all were answered. The patient agreed with the plan and demonstrated an understanding of the instructions.   Script sent to [facility] and opted to pick up RX.  The patient was advised to call their PCP or seek an in-person evaluation if the symptoms worsen or if the condition fails to improve as anticipated.   I provided 25 minutes of non face-to-face telephone visit time during this encounter, and > 50% was spent counseling as documented under my assessment & plan.

## 2020-10-28 NOTE — Progress Notes (Signed)
Patient is a 40 year old female here for follow-up after undergoing bilateral breast reduction with Dr. Ulice Bold on 08/15/2020.  ~ 11 weeks PO Patient reports she is doing well.  She has some concerns about firmness and dimpling on the left breast IMF incision.  Denies fever/chills, nausea/vomiting, pain.  Bilateral incisions are healing very nicely, C/D/I.  No signs of infection, redness, drainage.  Firmness and dimpling of the left breast IMF is due to some scar tissue and and fat necrosis.  Recommend firm circular massage a few times a day to help body break this down.  No signs of seroma/hematoma.  Patient is very pleased with her results and the immediate relief from her back pain.  Recommend following up as needed.  Return precautions provided.  Call office with any questions/concerns.  Pictures were obtained of the patient and placed in the chart with the patient's or guardian's permission.

## 2020-10-30 ENCOUNTER — Encounter: Payer: Self-pay | Admitting: Plastic Surgery

## 2020-10-30 ENCOUNTER — Ambulatory Visit (INDEPENDENT_AMBULATORY_CARE_PROVIDER_SITE_OTHER): Payer: No Typology Code available for payment source | Admitting: Plastic Surgery

## 2020-10-30 ENCOUNTER — Other Ambulatory Visit: Payer: Self-pay

## 2020-10-30 VITALS — BP 115/63 | HR 91

## 2020-10-30 DIAGNOSIS — Z9889 Other specified postprocedural states: Secondary | ICD-10-CM

## 2020-10-30 NOTE — Addendum Note (Signed)
Addended by: Verdie Shire on: 10/30/2020 11:36 AM   Modules accepted: Orders

## 2020-11-06 ENCOUNTER — Ambulatory Visit: Payer: No Typology Code available for payment source | Admitting: Plastic Surgery

## 2020-11-09 ENCOUNTER — Other Ambulatory Visit (HOSPITAL_COMMUNITY): Payer: Self-pay | Admitting: Nurse Practitioner

## 2020-11-09 MED FILL — SERTRALINE HCL 100 MG TAB: 100 | 90 days supply | Qty: 90 | Fill #0

## 2020-11-13 ENCOUNTER — Ambulatory Visit: Payer: Medicaid Other | Admitting: Plastic Surgery

## 2020-11-21 MED FILL — SERTRALINE HCL 100 MG TAB: 100 | 90 days supply | Qty: 90 | Fill #0

## 2020-12-05 ENCOUNTER — Other Ambulatory Visit: Payer: Self-pay | Admitting: Endocrinology

## 2020-12-05 DIAGNOSIS — E063 Autoimmune thyroiditis: Secondary | ICD-10-CM

## 2020-12-13 MED FILL — ATORVASTATIN CALCIUM 20 MG: 20 | 90 days supply | Qty: 90 | Fill #2

## 2020-12-13 MED FILL — LEVOTHYROXINE 50 MCG TABLET: 50 | 90 days supply | Qty: 90 | Fill #2

## 2020-12-19 ENCOUNTER — Ambulatory Visit
Admission: RE | Admit: 2020-12-19 | Discharge: 2020-12-19 | Disposition: A | Discharge status: Home / Self Care | Payer: No Typology Code available for payment source | Source: Ambulatory Visit | Attending: Endocrinology | Admitting: Endocrinology

## 2020-12-19 DIAGNOSIS — E063 Autoimmune thyroiditis: Secondary | ICD-10-CM

## 2020-12-23 ENCOUNTER — Telehealth: Payer: No Typology Code available for payment source | Admitting: Nurse Practitioner

## 2020-12-23 DIAGNOSIS — J069 Acute upper respiratory infection, unspecified: Secondary | ICD-10-CM | POA: Diagnosis not present

## 2020-12-23 MED ORDER — PSEUDOEPH-BROMPHEN-DM 30-2-10 MG/5ML PO SYRP: 5.0000 mL | ORAL_SOLUTION | Freq: Four times a day (QID) | ORAL | 0 refills | Status: AC | PRN

## 2020-12-23 MED ORDER — FLUTICASONE PROPIONATE 50 MCG/ACT NA SUSP: 2.0000 | Freq: Every day | NASAL | 0 refills | Status: DC

## 2020-12-23 NOTE — Progress Notes (Signed)
We are sorry you are not feeling well.  Here is how we plan to help!  Based on what you have shared with me, it looks like you may have a viral upper respiratory infection.  Upper respiratory infections are caused by a large number of viruses; however, rhinovirus is the most common cause.   Symptoms vary from person to person, with common symptoms including sore throat, cough, fatigue or lack of energy and feeling of general discomfort.  A low-grade fever of up to 100.4 may present, but is often uncommon.  Symptoms vary however, and are closely related to a person's age or underlying illnesses.  The most common symptoms associated with an upper respiratory infection are nasal discharge or congestion, cough, sneezing, headache and pressure in the ears and face.  These symptoms usually persist for about 3 to 10 days, but can last up to 2 weeks.  It is important to know that upper respiratory infections do not cause serious illness or complications in most cases.    Upper respiratory infections can be transmitted from person to person, with the most common method of transmission being a person's hands.  The virus is able to live on the skin and can infect other persons for up to 2 hours after direct contact.  Also, these can be transmitted when someone coughs or sneezes; thus, it is important to cover the mouth to reduce this risk.  To keep the spread of the illness at bay, good hand hygiene is very important.  This is an infection that is most likely caused by a virus. There are no specific treatments other than to help you with the symptoms until the infection runs its course.  We are sorry you are not feeling well.  Here is how we plan to help!   For nasal congestion, you may use an oral decongestants such as Mucinex D or if you have glaucoma or high blood pressure use plain Mucinex.  Saline nasal spray or nasal drops can help and can safely be used as often as needed for congestion.  For your congestion,  I have prescribed Fluticasone nasal spray one spray in each nostril twice a day  If you do not have a history of heart disease, hypertension, diabetes or thyroid disease, prostate/bladder issues or glaucoma, you may also use Sudafed to treat nasal congestion.  It is highly recommended that you consult with a pharmacist or your primary care physician to ensure this medication is safe for you to take.     If you have a cough, you may use cough suppressants such as Delsym and Robitussin.  If you have glaucoma or high blood pressure, you can also use Coricidin HBP.   For cough I have prescribed for you Bromfed DM. You can take 22mL every 6 hours as needed for cough.  If you have a sore or scratchy throat, use a saltwater gargle-  to  teaspoon of salt dissolved in a 4-ounce to 8-ounce glass of warm water.  Gargle the solution for approximately 15-30 seconds and then spit.  It is important not to swallow the solution.  You can also use throat lozenges/cough drops and Chloraseptic spray to help with throat pain or discomfort.  Warm or cold liquids can also be helpful in relieving throat pain.  For headache, pain or general discomfort, you can use Ibuprofen or Tylenol as directed.   Some authorities believe that zinc sprays or the use of Echinacea may shorten the course of your symptoms.  HOME CARE . Only take medications as instructed by your medical team. . Be sure to drink plenty of fluids. Water is fine as well as fruit juices, sodas and electrolyte beverages. You may want to stay away from caffeine or alcohol. If you are nauseated, try taking small sips of liquids. How do you know if you are getting enough fluid? Your urine should be a pale yellow or almost colorless. . Get rest. . Taking a steamy shower or using a humidifier may help nasal congestion and ease sore throat pain. You can place a towel over your head and breathe in the steam from hot water coming from a faucet. . Using a saline nasal  spray works much the same way. . Cough drops, hard candies and sore throat lozenges may ease your cough. . Avoid close contacts especially the very young and the elderly . Cover your mouth if you cough or sneeze . Always remember to wash your hands.   GET HELP RIGHT AWAY IF: . You develop worsening fever. . If your symptoms do not improve within 10 days . You develop yellow or green discharge from your nose over 3 days. . You have coughing fits . You develop a severe head ache or visual changes. . You develop shortness of breath, difficulty breathing or start having chest pain . Your symptoms persist after you have completed your treatment plan  MAKE SURE YOU   Understand these instructions.  Will watch your condition.  Will get help right away if you are not doing well or get worse.  Your e-visit answers were reviewed by a board certified advanced clinical practitioner to complete your personal care plan. Depending upon the condition, your plan could have included both over the counter or prescription medications. Please review your pharmacy choice. If there is a problem, you may call our nursing hot line at and have the prescription routed to another pharmacy. Your safety is important to [place]. If you have drug allergies check your prescription carefully.   You can use MyChart to ask questions about today's visit, request a non-urgent call back, or ask for a work or school excuse for 24 hours related to this e-Visit. If it has been greater than 24 hours you will need to follow up with your provider, or enter a new e-Visit to address those concerns. You will get an e-mail in the next two days asking about your experience.  I hope that your e-visit has been valuable and will speed your recovery. Thank you for using e-visits.  I have spent at least 5 minutes reviewing and documenting in the patient's chart.

## 2020-12-24 ENCOUNTER — Other Ambulatory Visit (HOSPITAL_COMMUNITY): Payer: Self-pay

## 2020-12-24 MED ORDER — ALBUTEROL SULFATE HFA 108 (90 BASE) MCG/ACT IN AERS
1.0000 | INHALATION_SPRAY | RESPIRATORY_TRACT | 0 refills | Status: DC | PRN
  Filled 2020-12-24: qty 8.5, 17d supply, fill #0

## 2020-12-24 MED ORDER — AZITHROMYCIN 250 MG PO TABS
ORAL_TABLET | ORAL | 0 refills | Status: DC
  Filled 2020-12-24: qty 6, 5d supply, fill #0

## 2021-02-26 ENCOUNTER — Other Ambulatory Visit (HOSPITAL_COMMUNITY): Payer: Self-pay

## 2021-02-26 MED FILL — Sertraline HCl Tab 100 MG: ORAL | 90 days supply | Qty: 90 | Fill #0 | Status: AC

## 2021-03-20 ENCOUNTER — Other Ambulatory Visit (HOSPITAL_BASED_OUTPATIENT_CLINIC_OR_DEPARTMENT_OTHER): Payer: Self-pay

## 2021-03-20 MED FILL — Atorvastatin Calcium Tab 20 MG (Base Equivalent): ORAL | 90 days supply | Qty: 90 | Fill #0 | Status: AC

## 2021-03-20 MED FILL — Levothyroxine Sodium Tab 50 MCG: ORAL | 30 days supply | Qty: 30 | Fill #0 | Status: CN

## 2021-03-22 ENCOUNTER — Other Ambulatory Visit (HOSPITAL_COMMUNITY): Payer: Self-pay

## 2021-03-22 ENCOUNTER — Other Ambulatory Visit (HOSPITAL_BASED_OUTPATIENT_CLINIC_OR_DEPARTMENT_OTHER): Payer: Self-pay

## 2021-03-22 MED FILL — Levothyroxine Sodium Tab 50 MCG: ORAL | 30 days supply | Qty: 30 | Fill #0 | Status: AC

## 2021-04-16 ENCOUNTER — Other Ambulatory Visit (HOSPITAL_COMMUNITY): Payer: Self-pay

## 2021-04-16 MED ORDER — LEVOTHYROXINE SODIUM 50 MCG PO TABS
50.0000 ug | ORAL_TABLET | Freq: Every morning | ORAL | 2 refills | Status: DC
  Filled 2021-04-16: qty 90, 90d supply, fill #0

## 2021-06-12 ENCOUNTER — Other Ambulatory Visit (HOSPITAL_COMMUNITY): Payer: Self-pay

## 2021-06-12 MED FILL — Sertraline HCl Tab 100 MG: ORAL | 90 days supply | Qty: 90 | Fill #1 | Status: AC

## 2021-06-24 ENCOUNTER — Encounter: Payer: Self-pay | Admitting: Neurology

## 2021-06-24 ENCOUNTER — Other Ambulatory Visit (HOSPITAL_COMMUNITY): Payer: Self-pay

## 2021-06-24 ENCOUNTER — Ambulatory Visit (INDEPENDENT_AMBULATORY_CARE_PROVIDER_SITE_OTHER): Payer: No Typology Code available for payment source | Admitting: Neurology

## 2021-06-24 VITALS — BP 136/84 | HR 93 | Ht 66.0 in | Wt 181.0 lb

## 2021-06-24 DIAGNOSIS — G43109 Migraine with aura, not intractable, without status migrainosus: Secondary | ICD-10-CM

## 2021-06-24 MED ORDER — NAPROXEN 500 MG PO TABS
500.0000 mg | ORAL_TABLET | Freq: Every day | ORAL | 0 refills | Status: AC
  Filled 2021-06-24: qty 30, 30d supply, fill #0

## 2021-06-24 MED ORDER — SUMATRIPTAN SUCCINATE 50 MG PO TABS
50.0000 mg | ORAL_TABLET | ORAL | 2 refills | Status: DC | PRN
Start: 2021-06-24 — End: 2021-06-24
  Filled 2021-06-24: qty 10, 1d supply, fill #0

## 2021-06-24 NOTE — Progress Notes (Signed)
[facility]  PATIENT: Tracy Tran DOB: 04-20-1981  REFERRING CLINICIAN: Sheliah Hatch, PA-C HISTORY FROM: Patient  REASON FOR VISIT: Migraines    HISTORICAL  CHIEF COMPLAINT:  Chief Complaint  Patient presents with   Migraine    New patient: paper referral: migraines that mimic stroke Room 12, alone in room    HISTORY OF PRESENT ILLNESS:  This is a 40 year old woman with past medical history of Hashimoto thyroiditis, hyperlipidemia, anxiety who is presenting with episodes of slurred speech, aphasia and weakness.  Patient stated the first episode started in 2019 when she had a sudden onset of decreased visual field, sensitivity to noise followed by inability to speak or comprehend.  She was taken to [facility] ED to rule out stroke, initial work-up included MRI and CT angiogram were all negative and patient symptoms resolved after 45-minutes to 1 hour.  She was diagnosed with Basilar migraine and was told to follow-up with neurology.  Patient stated unfortunately she did not follow-up with neurology and she has been doing fine until about 94-month ago when the symptoms restarted.  She said in the past 14-month she had 3 episodes very similar, episode started with sensitivity to noise followed by constriction of her visual field then difficulty with speech.  She stated that she cannot speak, she cannot understand and when she tried to speak it is gibberish.  She stated she is aware of her surroundings, knows where she is but she is just unable to speak.  During this time also, she noted flailing of both arms.  She states she cannot control them, it seems like the arms have a mind of their own.  Family history includes typical migraines in mother and sister.   During the episode, she takes liquid Benadryl, Tylenol and Motrin. She has not tried any preventive medications.    OTHER MEDICAL CONDITIONS: Hashimoto, HLD,    REVIEW OF SYSTEMS: Full 14 system review of  systems performed and negative with exception of: as noted in the HPI  ALLERGIES: Allergies  Allergen Reactions   Codeine Other (See Comments)    HOME MEDICATIONS: Outpatient Medications Prior to Visit  Medication Sig Dispense Refill   atorvastatin (LIPITOR) 20 MG tablet TK 1 T PO QD     levothyroxine (SYNTHROID) 50 MCG tablet Take 1 tablet (50 mcg total) by mouth every morning on an empty stomach 90 tablet 2   Multiple Vitamin (MULTIVITAMIN) tablet Take 1 tablet by mouth daily.     sertraline (ZOLOFT) 100 MG tablet TAKE 1 TABLET BY MOUTH ONCE A DAY 90 tablet 3   albuterol (VENTOLIN HFA) 108 (90 Base) MCG/ACT inhaler Inhale 1-2 puffs into the lungs every 4 (four) hours as needed. 8.5 g 0   atorvastatin (LIPITOR) 20 MG tablet TAKE 1 TABLET BY MOUTH ONCE DAILY (NEED FASTING LAB APPOINTMENT) 330 tablet 0   atorvastatin (LIPITOR) 20 MG tablet TAKE 1 TABLET BY MOUTH ONCE A DAY 90 tablet 3   azithromycin (ZITHROMAX) 250 MG tablet Take 2 tablets by mouth on the first day, then take 1 tablet by mouth daily for the next 4 days 6 tablet 0   cephALEXin (KEFLEX) 500 MG capsule TAKE 1 CAPSULE (500 MG TOTAL) BY MOUTH 4 (FOUR) TIMES DAILY FOR 3 DAYS. 12 capsule 0   dexamethasone (DECADRON) 2 MG tablet TAKE 3 TABLETS (6 MG TOTAL) BY MOUTH 2 (TWO) TIMES DAILY. 42 tablet 0   fluticasone (FLONASE) 50 MCG/ACT nasal spray Place 2 sprays into  both nostrils daily for 14 days. 16 g 0   levothyroxine (SYNTHROID) 75 MCG tablet Take 75 mcg by mouth daily.     Molnupiravir 200 MG CAPS TAKE 4 CAPSULES BY MOUTH 2 TIMES DAILY FOR 5 DAYS. 40 capsule 0   sertraline (ZOLOFT) 50 MG tablet Take 100 mg by mouth daily.      sertraline (ZOLOFT) 50 MG tablet TAKE 1 & 1/2 TABLETS BY MOUTH DAILY. 135 tablet 3   No facility-administered medications prior to visit.    PAST MEDICAL HISTORY: Past Medical History:  Diagnosis Date   Anxiety    GERD (gastroesophageal reflux disease)    TUMS   Hashimoto's disease 2020   Headache     Hypothyroidism    Macromastia    Pregnancy induced hypertension    with previous pregnancy   Raynaud disease    Vaginal Pap smear, abnormal     PAST SURGICAL HISTORY: Past Surgical History:  Procedure Laterality Date   BREAST REDUCTION SURGERY Bilateral 08/15/2020   Procedure: BREAST REDUCTION BILATERAL;  Surgeon: Peggye Form, DO;  Location: [facility];  Service: Plastics;  Laterality: Bilateral;  3.5 hours   CERVICAL BIOPSY  W/ LOOP ELECTRODE EXCISION     WISDOM TOOTH EXTRACTION      FAMILY HISTORY: Family History  Problem Relation Age of Onset   Heart disease Father    Hypertension Father    Heart disease Mother    Hypertension Mother    Thyroid disease Mother    Diabetes Maternal Grandfather     SOCIAL HISTORY: Social History   Socioeconomic History   Marital status: Married    Spouse name: Larina Earthly   Number of children: Not on file   Years of education: Not on file   Highest education level: Not on file  Occupational History   Not on file  Tobacco Use   Smoking status: Former    Types: Cigarettes    Quit date: 10/31/2017    Years since quitting: 3.6   Smokeless tobacco: Never  Substance and Sexual Activity   Alcohol use: No   Drug use: No   Sexual activity: Yes    Birth control/protection: None    Comment: husband has had vasectomy  Other Topics Concern   Not on file  Social History Narrative   Lives at home with Husband and 3 children   Right Handed   Drinks 1 cup caffeine daily   Social Determinants of Health   Financial Resource Strain: Not on file  Food Insecurity: Not on file  Transportation Needs: Not on file  Physical Activity: Not on file  Stress: Not on file  Social Connections: Not on file  Intimate Partner Violence: Not on file     PHYSICAL EXAM  GENERAL EXAM/CONSTITUTIONAL: Vitals:  Vitals:   06/24/21 0914  BP: 136/84  Pulse: 93  Weight: 181 lb (82.1 kg)  Height: 5\' 6"  (1.676 m)   Body mass index  is 29.21 kg/m. Wt Readings from Last 3 Encounters:  06/24/21 181 lb (82.1 kg)  08/15/20 183 lb 6.8 oz (83.2 kg)  07/31/20 184 lb 9.6 oz (83.7 kg)   Patient is in no distress; well developed, nourished and groomed; neck is supple  EYES: Pupils round and reactive to light, Visual fields full to confrontation, Extraocular movements intacts,   MUSCULOSKELETAL: Gait, strength, tone, movements noted in Neurologic exam below  NEUROLOGIC: MENTAL STATUS:  No flowsheet data found. awake, alert, oriented to person, place and time recent  and remote memory intact normal attention and concentration language fluent, comprehension intact, naming intact fund of knowledge appropriate  CRANIAL NERVE:  2nd - no papilledema or hemorrhages on fundoscopic exam 2nd, 3rd, 4th, 6th - pupils equal and reactive to light, visual fields full to confrontation, extraocular muscles intact, no nystagmus 5th - facial sensation symmetric 7th - facial strength symmetric 8th - hearing intact 9th - palate elevates symmetrically, uvula midline 11th - shoulder shrug symmetric 12th - tongue protrusion midline  MOTOR:  normal bulk and tone, full strength in the BUE, BLE  SENSORY:  normal and symmetric to light touch, pinprick, temperature, vibration  COORDINATION:  finger-nose-finger, fine finger movements normal  REFLEXES:  deep tendon reflexes present and symmetric  GAIT/STATION:  normal   DIAGNOSTIC DATA (LABS, IMAGING, TESTING) - I reviewed patient records, labs, notes, testing and imaging myself where available.  Lab Results  Component Value Date   WBC 6.1 03/02/2018   HGB 15.0 03/02/2018   HCT 44.0 03/02/2018   MCV 89.7 03/02/2018   PLT 247 03/02/2018      Component Value Date/Time   NA 138 03/02/2018 1158   K 3.6 03/02/2018 1158   CL 103 03/02/2018 1158   CO2 23 03/02/2018 1142   GLUCOSE 100 (H) 03/02/2018 1158   BUN 12 03/02/2018 1158   CREATININE 0.80 03/02/2018 1158   CALCIUM 9.4  03/02/2018 1142   PROT 7.0 03/02/2018 1142   ALBUMIN 4.3 03/02/2018 1142   AST 27 03/02/2018 1142   ALT 21 03/02/2018 1142   ALKPHOS 57 03/02/2018 1142   BILITOT 0.6 03/02/2018 1142   GFRNONAA >60 03/02/2018 1142   GFRAA >60 03/02/2018 1142   Lab Results  Component Value Date   CHOL 174 12/04/2009   HDL 58.40 12/04/2009   LDLCALC 99 12/04/2009   TRIG 83.0 12/04/2009   CHOLHDL 3 12/04/2009   No results found for: HGBA1C No results found for: VITAMINB12 No results found for: TSH  Head CT 02/2018: 1. Normal examination.  CT Angio head and neck, CTP 02/2018: Normal CT angiography of the neck and head. Normal CT perfusion.  MRI Brain 02/2018: No acute intracranial findings. Minor foci of predominantly subcortical white matter signal abnormality, nonspecific. Demyelinating disease is not favored; see discussion above.    ASSESSMENT AND PLAN  40 y.o. year old female with past medical history of Hashimoto thyroiditis, hyperlipidemia and family history of migraine headaches (sister and mother) presenting with episode of difficulty speaking, trouble with comprehension associated with flailing of arms lasting 30 minutes to hours with return to normal baseline.  Patient stated that these symptoms are not associated with any headache but she has sensitivity to noise and sometimes flashing of light or shimmering of light.  She had a full stroke work-up including angiogram and MRI which was negative other than subcortical white matter signal abnormality which are nonspecific.  Patient symptoms likely are basilar migraine but she required additional work-up including MRI and EEG.  I will also start her on naproxen 500 mg to take as soon as her symptoms start.  I will contact her after the completion of the MRI and EEG to go over the results otherwise I will see her in 106-month.   1. Basilar migraine     PLAN: Start with Naproxen 500 mg for the pain  MRI brain with and without contrast  Routine  EEG  Follow up in 3 months   Orders Placed This Encounter  Procedures   MR BRAIN W  WO CONTRAST   EEG adult    Meds ordered this encounter  Medications   DISCONTD: SUMAtriptan (IMITREX) 50 MG tablet    Sig: Take 1 tablet (50 mg total) by mouth every 2 (two) hours as needed for migraine. May repeat in 2 hours if headache persists or recurs.    Dispense:  10 tablet    Refill:  2   naproxen (NAPROSYN) 500 MG tablet    Sig: Take 1 tablet (500 mg total) by mouth daily.    Dispense:  30 tablet    Refill:  0    Return in about 3 months (around 09/24/2021).    Windell Norfolk, MD 06/24/2021, 12:48 PM  [facility] [address] [place], [place] [address] [phone]

## 2021-06-24 NOTE — Patient Instructions (Signed)
Start with Naproxen 500 mg for the pain  MRI brain with and without contrast  Routine EEG  Follow up in 3 months

## 2021-06-27 ENCOUNTER — Telehealth: Payer: Self-pay | Admitting: Neurology

## 2021-06-27 NOTE — Telephone Encounter (Signed)
[facility] auth: [id number] (exp. 07/02/21 to 08/01/21)[facility] healthy no auth require since it is secondary- order sent to GI. they will reach out to the patient to schedule

## 2021-06-29 ENCOUNTER — Other Ambulatory Visit: Payer: Self-pay

## 2021-06-29 ENCOUNTER — Ambulatory Visit
Admission: RE | Admit: 2021-06-29 | Discharge: 2021-06-29 | Disposition: A | Discharge status: Home / Self Care | Payer: No Typology Code available for payment source | Source: Ambulatory Visit | Attending: Neurology | Admitting: Neurology

## 2021-06-29 DIAGNOSIS — G43109 Migraine with aura, not intractable, without status migrainosus: Secondary | ICD-10-CM

## 2021-06-29 MED ORDER — GADOBENATE DIMEGLUMINE 529 MG/ML IV SOLN
17.0000 mL | Freq: Once | INTRAVENOUS | Status: AC | PRN
  Administered 2021-06-29: 17 mL via INTRAVENOUS

## 2021-07-02 ENCOUNTER — Other Ambulatory Visit (HOSPITAL_COMMUNITY): Payer: Self-pay

## 2021-07-10 ENCOUNTER — Telehealth: Payer: Self-pay | Admitting: Neurology

## 2021-07-10 NOTE — Telephone Encounter (Signed)
LVM & sent mychart msg asking pt to call back and schedule EEG.

## 2021-07-24 ENCOUNTER — Encounter: Payer: Self-pay | Admitting: *Deleted

## 2021-07-30 ENCOUNTER — Other Ambulatory Visit (HOSPITAL_COMMUNITY): Payer: Self-pay

## 2021-07-30 ENCOUNTER — Other Ambulatory Visit (HOSPITAL_BASED_OUTPATIENT_CLINIC_OR_DEPARTMENT_OTHER): Payer: Self-pay

## 2021-07-30 MED ORDER — ATORVASTATIN CALCIUM 20 MG PO TABS
20.0000 mg | ORAL_TABLET | Freq: Every day | ORAL | 3 refills | Status: DC
  Filled 2021-07-30: qty 90, 90d supply, fill #0

## 2021-09-30 ENCOUNTER — Ambulatory Visit: Payer: No Typology Code available for payment source | Admitting: Neurology

## 2021-11-29 IMAGING — US US THYROID
1 series · 14 of 25 positions shown · non-contrast
Comparison: None.

CLINICAL DATA: Autoimmune thyroiditis

EXAM:
THYROID ULTRASOUND
TECHNIQUE: Ultrasound examination of the thyroid gland and adjacent soft
tissues was performed.

[Series 1: us thyroid · 0.08mm/px · 14 of 35 slices shown]
[im 1/35]
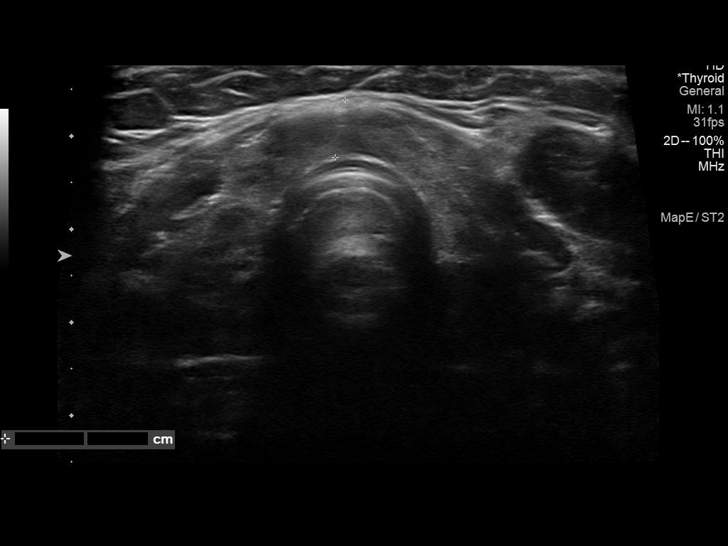
[im 3/35]
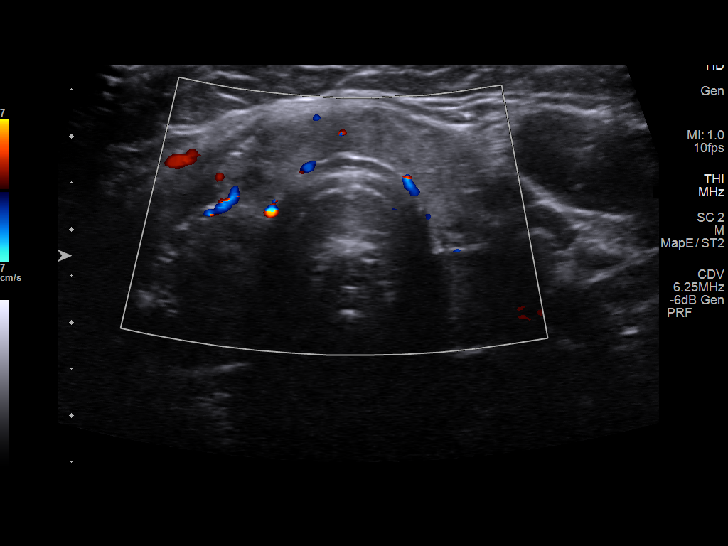
[im 6/35]
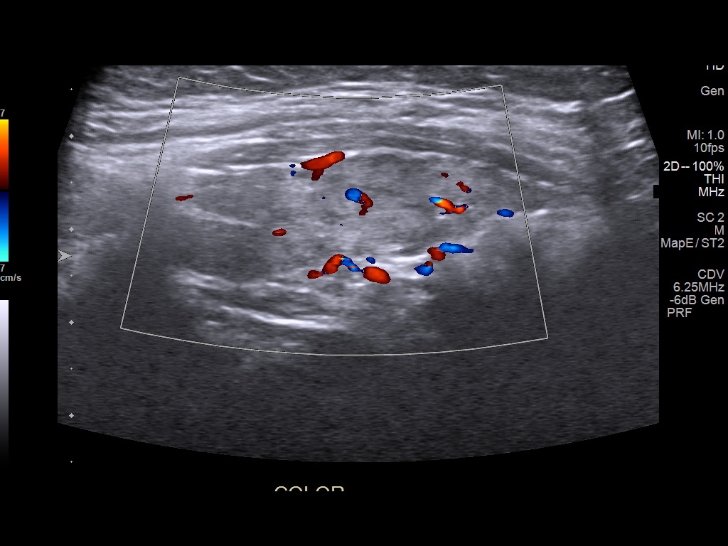
[im 9/35]
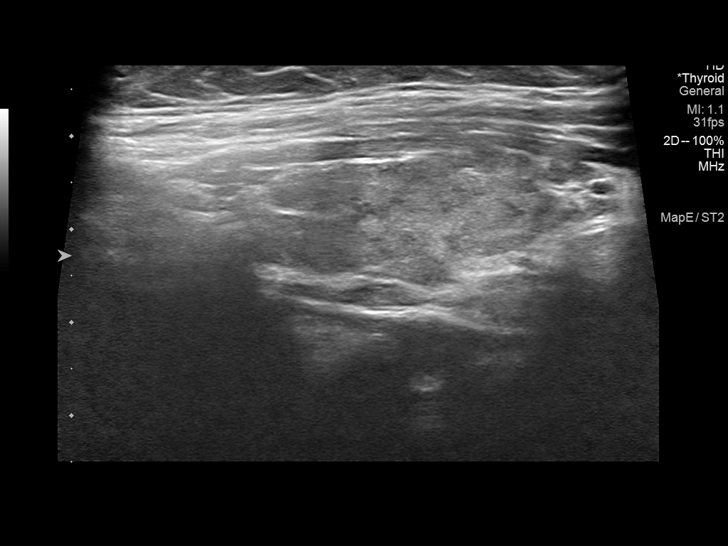
[im 12/35]
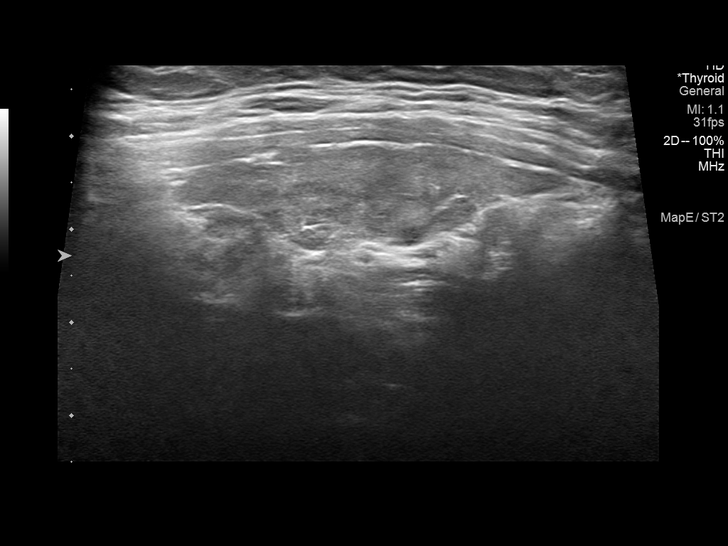
[im 13/35]
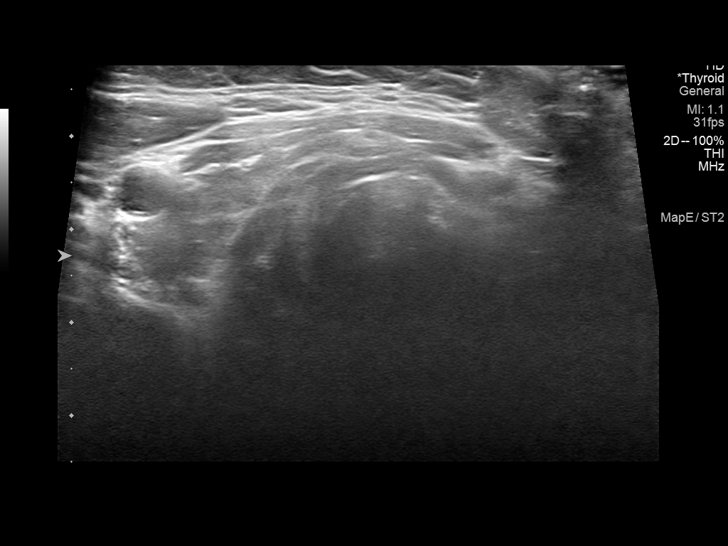
[im 16/35]
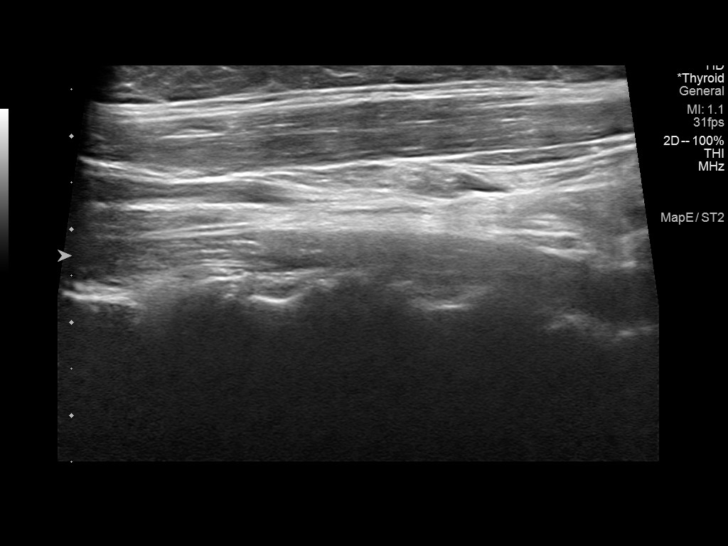
[im 19/35]
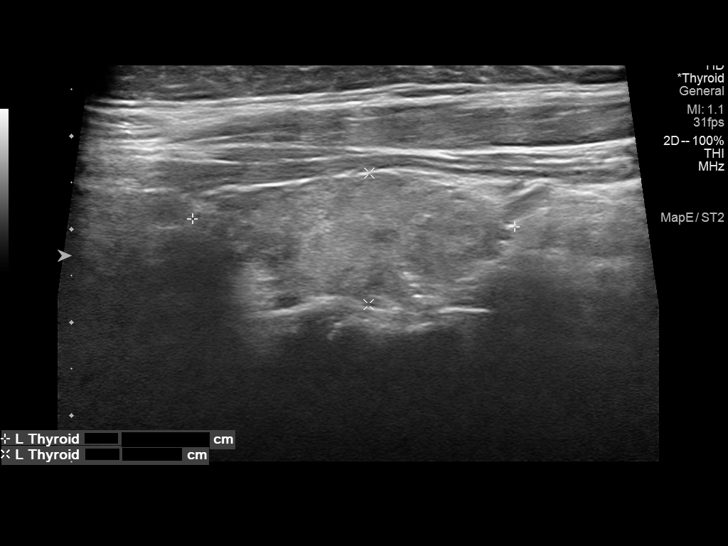
[im 22/35]
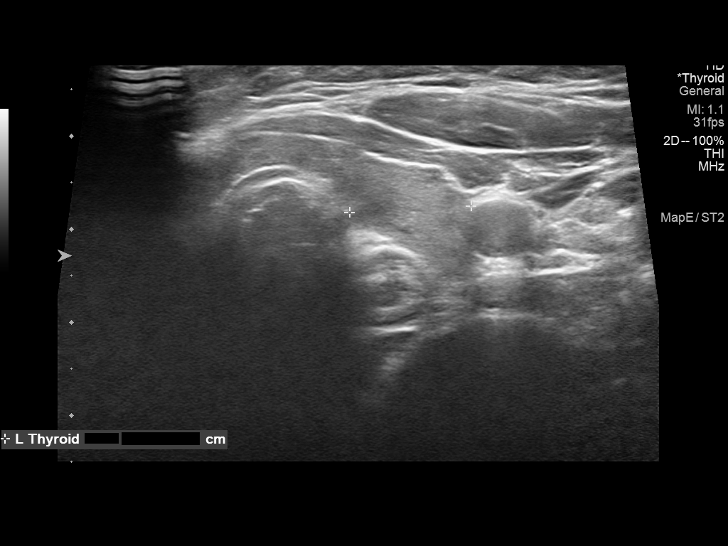
[im 23/35]
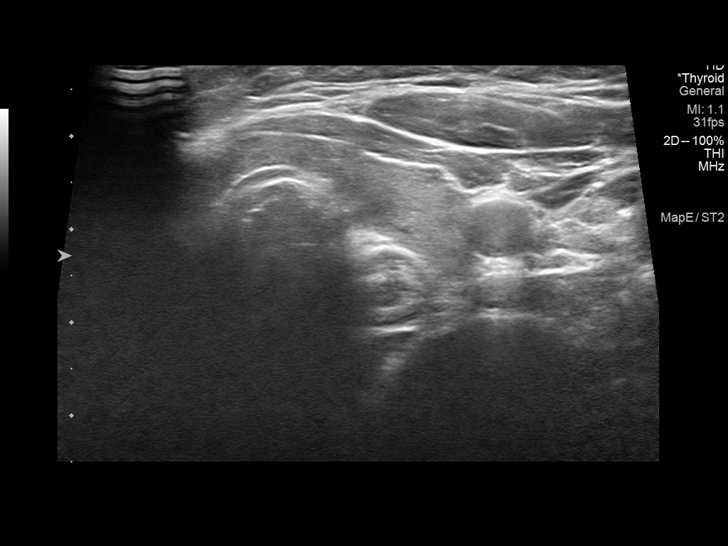
[im 26/35]
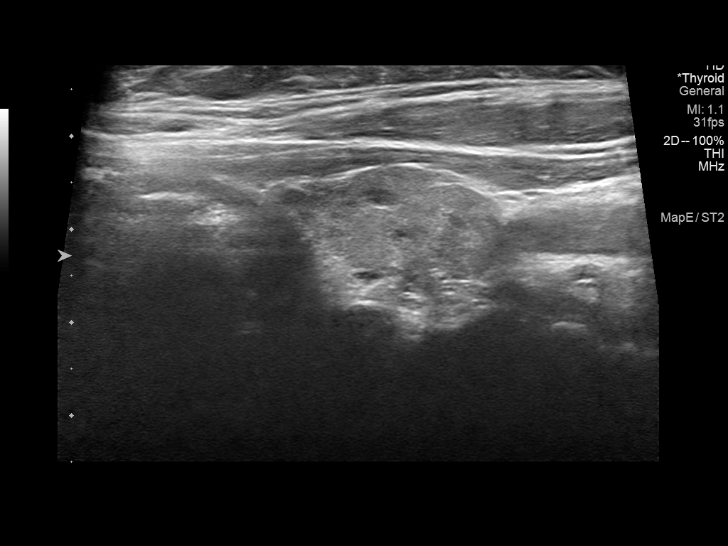
[im 29/35]
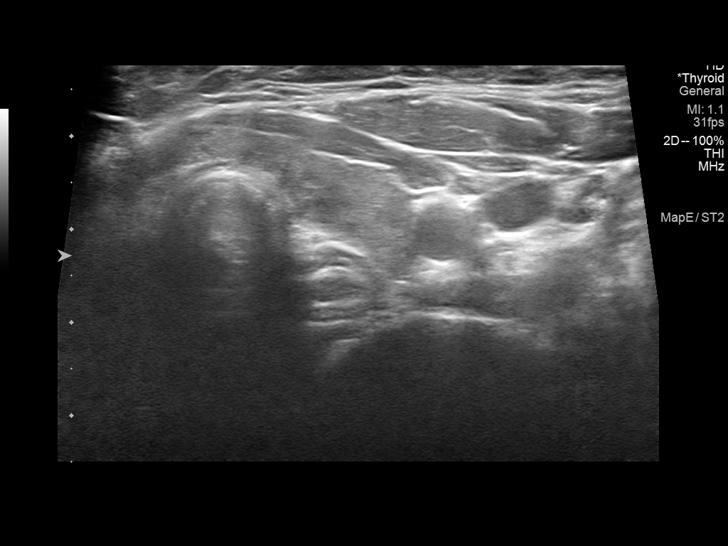
[im 32/35]
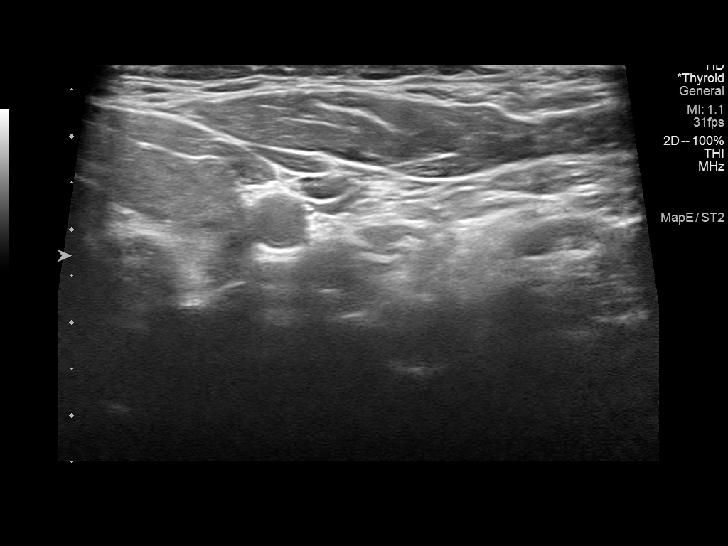
[im 35/35]
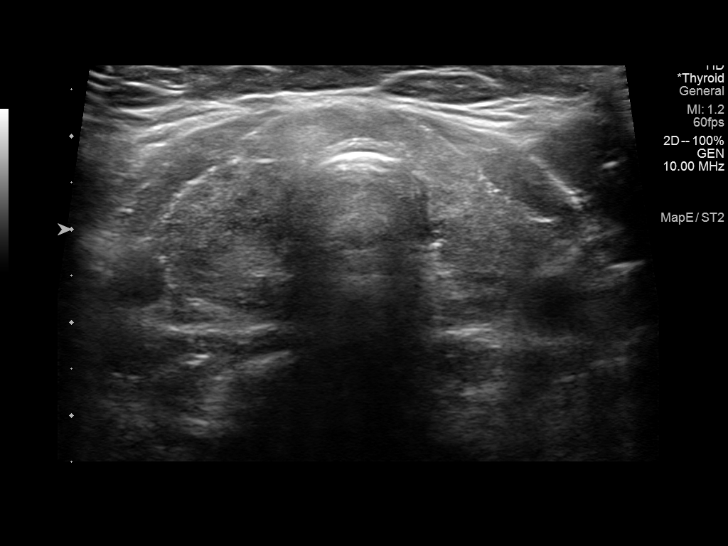

[14 of 25 positions shown; findings below may reference images not displayed]

FINDINGS: Parenchymal Echotexture: Mildly heterogeneous

Isthmus: 0.6 cm

Right lobe: 4.1 x 1.4 x 1.4 cm

Left lobe: 3.5 x 1.4 x 1.3 cm

_________________________________________________________

Estimated total number of nodules >/= 1 cm: 0

Number of spongiform nodules >/=  2 cm not described below (TR1): 0

Number of mixed cystic and solid nodules >/= 1.5 cm not described
below (TR2): 0

_________________________________________________________

No discrete nodules are seen within the thyroid gland.
IMPRESSION: Moderate diffuse heterogeneity of thyroid parenchyma discrete
nodule.

The above is in keeping with the ACR TI-RADS recommendations - [HOSPITAL] 2761;[DATE].

## 2021-12-04 ENCOUNTER — Other Ambulatory Visit (HOSPITAL_COMMUNITY): Payer: Self-pay

## 2021-12-04 MED ORDER — LEVOTHYROXINE SODIUM 50 MCG PO TABS
50.0000 ug | ORAL_TABLET | Freq: Every morning | ORAL | 5 refills | Status: DC
  Filled 2021-12-04: qty 60, 84d supply, fill #0

## 2021-12-12 ENCOUNTER — Other Ambulatory Visit (HOSPITAL_COMMUNITY): Payer: Self-pay

## 2022-01-14 ENCOUNTER — Other Ambulatory Visit (HOSPITAL_COMMUNITY): Payer: Self-pay

## 2022-01-17 ENCOUNTER — Other Ambulatory Visit (HOSPITAL_COMMUNITY): Payer: Self-pay

## 2022-01-23 ENCOUNTER — Other Ambulatory Visit (HOSPITAL_COMMUNITY): Payer: Self-pay

## 2022-01-24 ENCOUNTER — Other Ambulatory Visit (HOSPITAL_COMMUNITY): Payer: Self-pay

## 2022-01-24 MED ORDER — SERTRALINE HCL 100 MG PO TABS
100.0000 mg | ORAL_TABLET | Freq: Every day | ORAL | 0 refills | Status: DC
  Filled 2022-01-24: qty 90, 90d supply, fill #0

## 2022-01-27 ENCOUNTER — Other Ambulatory Visit (HOSPITAL_COMMUNITY): Payer: Self-pay

## 2022-02-13 ENCOUNTER — Other Ambulatory Visit (HOSPITAL_COMMUNITY): Payer: Self-pay

## 2022-02-13 MED ORDER — ROSUVASTATIN CALCIUM 10 MG PO TABS
10.0000 mg | ORAL_TABLET | Freq: Every day | ORAL | 3 refills | Status: DC
  Filled 2022-02-13: qty 90, 90d supply, fill #0

## 2022-02-14 ENCOUNTER — Other Ambulatory Visit (HOSPITAL_COMMUNITY): Payer: Self-pay

## 2022-02-14 MED ORDER — LEVOTHYROXINE SODIUM 25 MCG PO TABS
25.0000 ug | ORAL_TABLET | Freq: Every morning | ORAL | 1 refills | Status: DC
  Filled 2022-02-14: qty 90, 90d supply, fill #0
  Filled 2022-07-21: qty 90, 90d supply, fill #1

## 2022-02-14 MED ORDER — ROSUVASTATIN CALCIUM 20 MG PO TABS
20.0000 mg | ORAL_TABLET | Freq: Every day | ORAL | 1 refills | Status: DC
  Filled 2022-02-14: qty 20, 20d supply, fill #0
  Filled 2022-02-14: qty 70, 70d supply, fill #0

## 2022-02-14 MED ORDER — ATORVASTATIN CALCIUM 40 MG PO TABS
40.0000 mg | ORAL_TABLET | Freq: Every day | ORAL | 1 refills | Status: DC
  Filled 2022-02-14: qty 90, 90d supply, fill #0
  Filled 2022-07-21: qty 90, 90d supply, fill #1

## 2022-05-13 ENCOUNTER — Other Ambulatory Visit (HOSPITAL_COMMUNITY): Payer: Self-pay

## 2022-05-13 ENCOUNTER — Encounter: Payer: Self-pay | Admitting: Neurology

## 2022-05-13 ENCOUNTER — Ambulatory Visit (INDEPENDENT_AMBULATORY_CARE_PROVIDER_SITE_OTHER): Payer: No Typology Code available for payment source | Admitting: Neurology

## 2022-05-13 VITALS — BP 109/76 | HR 66 | Ht 66.0 in | Wt 166.0 lb

## 2022-05-13 DIAGNOSIS — F419 Anxiety disorder, unspecified: Secondary | ICD-10-CM

## 2022-05-13 DIAGNOSIS — G43109 Migraine with aura, not intractable, without status migrainosus: Secondary | ICD-10-CM

## 2022-05-13 DIAGNOSIS — E063 Autoimmune thyroiditis: Secondary | ICD-10-CM | POA: Diagnosis not present

## 2022-05-13 MED ORDER — NORTRIPTYLINE HCL 25 MG PO CAPS
25.0000 mg | ORAL_CAPSULE | Freq: Every day | ORAL | 0 refills | Status: DC
  Filled 2022-05-13: qty 30, 30d supply, fill #0

## 2022-05-13 NOTE — Progress Notes (Signed)
Tracy Tran  PATIENT: Tracy Tran DOB: 18-Dec-1980  REFERRING CLINICIAN: Pa, Tracy Tran* HISTORY FROM: Patient  REASON FOR VISIT: Migraines    HISTORICAL  CHIEF COMPLAINT:  Chief Complaint  Patient presents with   Follow-up    Room 12, alone 5-6 migraines a month, states they have increased, unaware of her triggers, states it could be related anxiety or elevated BP   INTERVAL HISTORY 05/13/2022:  Patient presents today for follow-up, at last visit in October plan was to get MRI and EEG.  MRI was completed, no acute abnormality but EEG was not completed.  She continued to have migraines, 5-6 episodes per month.  She reported during this time she could not speak, it is difficult for her to understand and her blood pressure is very high.   On top of that she reports her Zoloft was discontinued by her primary care doctor, right now she has increased anxiety and feel like this is related to her Hashimoto thyroiditis.  She is on Synthroid 25 mg daily but reported is very difficult for her to take the medication because of side effects from Synthroid.  She also reports a feeling of swallowing difficulty, denies choking/aspiration on food but does feel like she cannot swallow, but drinking through a straw cause her no problem.   She tried exercises, medications but no relief with her anxious feelings.    HISTORY OF PRESENT ILLNESS:  This is a 41 year old woman with past medical history of Hashimoto thyroiditis, hyperlipidemia, anxiety who is presenting with episodes of slurred speech, aphasia and weakness.  Patient stated the first episode started in 2019 when she had a sudden onset of decreased visual field, sensitivity to noise followed by inability to speak or comprehend.  She was taken to [facility] ED to rule out stroke, initial work-up included MRI and CT angiogram were all negative and patient symptoms resolved after 45-minutes to 1 hour.  She was diagnosed  with Basilar migraine and was told to follow-up with neurology.  Patient stated unfortunately she did not follow-up with neurology and she has been doing fine until about 70-month ago when the symptoms restarted.  She said in the past 81-month she had 3 episodes very similar, episode started with sensitivity to noise followed by constriction of her visual field then difficulty with speech.  She stated that she cannot speak, she cannot understand and when she tried to speak it is gibberish.  She stated she is aware of her surroundings, knows where she is but she is just unable to speak.  During this time also, she noted flailing of both arms.  She states she cannot control them, it seems like the arms have a mind of their own.  Family history includes typical migraines in mother and sister.   During the episode, she takes liquid Benadryl, Tylenol and Motrin. She has not tried any preventive medications.    OTHER MEDICAL CONDITIONS: Hashimoto thyroiditis, HLD, Anxiety   REVIEW OF SYSTEMS: Full 14 system review of systems performed and negative with exception of: as noted in the HPI  ALLERGIES: Allergies  Allergen Reactions   Codeine Other (See Comments)    HOME MEDICATIONS: Outpatient Medications Prior to Visit  Medication Sig Dispense Refill   atorvastatin (LIPITOR) 40 MG tablet Take 1 tablet (40 mg total) by mouth daily. 90 tablet 1   levothyroxine (SYNTHROID) 25 MCG tablet Take 1 tablet (25 mcg total) by mouth every morning on Tran empty stomach 90 tablet 1  Multiple Vitamin (MULTIVITAMIN) tablet Take 1 tablet by mouth daily.     atorvastatin (LIPITOR) 20 MG tablet TK 1 T PO QD     sertraline (ZOLOFT) 100 MG tablet Take 1 tablet (100 mg total) by mouth daily. 90 tablet 0   No facility-administered medications prior to visit.    PAST MEDICAL HISTORY: Past Medical History:  Diagnosis Date   Anxiety    GERD (gastroesophageal reflux disease)    TUMS   Hashimoto's disease 2020   Headache     Hypothyroidism    Macromastia    Pregnancy induced hypertension    with previous pregnancy   Raynaud disease    Vaginal Pap smear, abnormal     PAST SURGICAL HISTORY: Past Surgical History:  Procedure Laterality Date   BREAST REDUCTION SURGERY Bilateral 08/15/2020   Procedure: BREAST REDUCTION BILATERAL;  Surgeon: Peggye Form, DO;  Location: [facility];  Service: Plastics;  Laterality: Bilateral;  3.5 hours   CERVICAL BIOPSY  W/ LOOP ELECTRODE EXCISION     WISDOM TOOTH EXTRACTION      FAMILY HISTORY: Family History  Problem Relation Age of Onset   Heart disease Father    Hypertension Father    Heart disease Mother    Hypertension Mother    Thyroid disease Mother    Diabetes Maternal Grandfather     SOCIAL HISTORY: Social History   Socioeconomic History   Marital status: Married    Spouse name: Tracy Tran   Number of children: Not on file   Years of education: Not on file   Highest education level: Not on file  Occupational History   Not on file  Tobacco Use   Smoking status: Former    Types: Cigarettes    Quit date: 10/31/2017    Years since quitting: 4.5   Smokeless tobacco: Never  Substance and Sexual Activity   Alcohol use: No   Drug use: No   Sexual activity: Yes    Birth control/protection: None    Comment: husband has had vasectomy  Other Topics Concern   Not on file  Social History Narrative   Lives at home with Husband and 3 children   Right Handed   Drinks 1 cup caffeine daily   Social Determinants of Health   Financial Resource Strain: Not on file  Food Insecurity: Not on file  Transportation Needs: Not on file  Physical Activity: Not on file  Stress: Not on file  Social Connections: Not on file  Intimate Partner Violence: Not on file     PHYSICAL EXAM  GENERAL EXAM/CONSTITUTIONAL: Vitals:  Vitals:   05/13/22 1345  BP: 109/76  Pulse: 66  Weight: 166 lb (75.3 kg)  Height: 5\' 6"  (1.676 m)   Body mass index is  26.79 kg/m. Wt Readings from Last 3 Encounters:  05/13/22 166 lb (75.3 kg)  06/24/21 181 lb (82.1 kg)  08/15/20 183 lb 6.8 oz (83.2 kg)   Patient is in no distress; well developed, nourished and groomed; neck is supple. Tearful and crying during examination.   EYES: Pupils round and reactive to light, Visual fields full to confrontation, Extraocular movements intacts,   MUSCULOSKELETAL: Gait, strength, tone, movements noted in Neurologic exam below  NEUROLOGIC: MENTAL STATUS:      No data to display         awake, alert, oriented to person, place and time recent and remote memory intact normal attention and concentration language fluent, comprehension intact, naming intact fund of knowledge  appropriate  CRANIAL NERVE:  2nd - no papilledema or hemorrhages on fundoscopic exam 2nd, 3rd, 4th, 6th - pupils equal and reactive to light, visual fields full to confrontation, extraocular muscles intact, no nystagmus 5th - facial sensation symmetric 7th - facial strength symmetric 8th - hearing intact 9th - palate elevates symmetrically, uvula midline 11th - shoulder shrug symmetric 12th - tongue protrusion midline  MOTOR:  normal bulk and tone, full strength in the BUE, BLE  COORDINATION:  finger-nose-finger, fine finger movements normal  REFLEXES:  deep tendon reflexes present and symmetric  GAIT/STATION:  normal   DIAGNOSTIC DATA (LABS, IMAGING, TESTING) - I reviewed patient records, labs, notes, testing and imaging myself where available.  Lab Results  Component Value Date   WBC 6.1 03/02/2018   HGB 15.0 03/02/2018   HCT 44.0 03/02/2018   MCV 89.7 03/02/2018   PLT 247 03/02/2018      Component Value Date/Time   NA 138 03/02/2018 1158   K 3.6 03/02/2018 1158   CL 103 03/02/2018 1158   CO2 23 03/02/2018 1142   GLUCOSE 100 (H) 03/02/2018 1158   BUN 12 03/02/2018 1158   CREATININE 0.80 03/02/2018 1158   CALCIUM 9.4 03/02/2018 1142   PROT 7.0 03/02/2018  1142   ALBUMIN 4.3 03/02/2018 1142   AST 27 03/02/2018 1142   ALT 21 03/02/2018 1142   ALKPHOS 57 03/02/2018 1142   BILITOT 0.6 03/02/2018 1142   GFRNONAA >60 03/02/2018 1142   GFRAA >60 03/02/2018 1142   Lab Results  Component Value Date   CHOL 174 12/04/2009   HDL 58.40 12/04/2009   LDLCALC 99 12/04/2009   TRIG 83.0 12/04/2009   CHOLHDL 3 12/04/2009   No results found for: "HGBA1C" No results found for: "VITAMINB12" No results found for: "TSH"  Head CT 02/2018: 1. Normal examination.  CT Angio head and neck, CTP 02/2018: Normal CT angiography of the neck and head. Normal CT perfusion.  MRI Brain 02/2018: No acute intracranial findings. Minor foci of predominantly subcortical white matter signal abnormality, nonspecific. Demyelinating disease is not favored; see discussion above.   MRI BRAIN 06/30/2021 This MRI of the brain with and without contrast shows the following: 1.   Several punctate T2/FLAIR hyperintense foci in the subcortical and deep white matter of the hemispheres.  This is a nonspecific finding but is most consistent with minimal chronic microvascular ischemic change or the sequela of migraine headaches.  The pattern is not typical for demyelination.  The foci were present on the MRI from 03/02/2018 and there were no definite changes. 2.   No acute findings.  Normal enhancement pattern.    ASSESSMENT AND PLAN  41 y.o. year old female with past medical history of Hashimoto thyroiditis, hyperlipidemia and family history of migraine headaches (sister and mother) following up for her likely basilar migraines described as episodes of difficulty speaking, trouble with comprehension associated with flailing of arms lasting 30 minutes to hours with return to normal baseline.   Currently will have 5 to 6 episodes per months, EEG is still pending. I have explained to patient that we need to complete the EEG to rule out focal seizure. For her headaches, I will start her on  Nortriptyline 25 mg with plan to increase the dose if her headaches are not controlled. Follow up in 6 months or sooner if worse.     1. Basilar migraine   2. Hashimoto's thyroiditis   3. Anxiety     Patient Instructions  Routine  EEG, I will contact you to go over the result Start nortriptyline 25 mg nightly, contact me in a month to update me on the frequency of these headaches, if frequency remains the same we will increase to 50 mg nightly Continue to follow with your PCP for management of anxiety symptoms.  Return in 6 months or sooner if worse.    No orders of the defined types were placed in this encounter.   Meds ordered this encounter  Medications   nortriptyline (PAMELOR) 25 MG capsule    Sig: Take 1 capsule (25 mg total) by mouth at bedtime.    Dispense:  30 capsule    Refill:  0    Return in about 6 months (around 11/13/2022).  I have spent a total of 45 minutes dedicated to this patient today, preparing to see patient, performing a medically appropriate examination and evaluation, ordering tests and/or medications and procedures, and counseling and educating the patient/family/caregiver; independently interpreting result and communicating results to the family/patient/caregiver; and documenting clinical information in the electronic medical record.   Windell Norfolk, MD 05/13/2022, 4:56 PM  [facility] [address], [address] [place], [place] [address] [phone]

## 2022-05-13 NOTE — Patient Instructions (Addendum)
Routine EEG, I will contact you to go over the result Start nortriptyline 25 mg nightly, contact me in a month to update me on the frequency of these headaches, if frequency remains the same we will increase to 50 mg nightly Continue to follow with your PCP for management of anxiety symptoms.  Return in 6 months or sooner if worse.

## 2022-05-14 ENCOUNTER — Ambulatory Visit (INDEPENDENT_AMBULATORY_CARE_PROVIDER_SITE_OTHER): Payer: No Typology Code available for payment source | Admitting: Neurology

## 2022-05-14 DIAGNOSIS — G43109 Migraine with aura, not intractable, without status migrainosus: Secondary | ICD-10-CM

## 2022-05-14 DIAGNOSIS — R410 Disorientation, unspecified: Secondary | ICD-10-CM | POA: Diagnosis not present

## 2022-05-14 DIAGNOSIS — R404 Transient alteration of awareness: Secondary | ICD-10-CM

## 2022-05-14 NOTE — Procedures (Signed)
    History:  41 year old woman with episode of altered mental status   EEG classification: Awake and drowsy  Description of the recording: The background rhythms of this recording consists of a fairly well modulated medium amplitude alpha rhythm of 11 Hz that is reactive to eye opening and closure. As the record progresses, the patient appears to remain in the waking state throughout the recording. Photic stimulation was performed, did not show any abnormalities. Hyperventilation was also performed, did not show any abnormalities. Toward the end of the recording, the patient enters the drowsy state with slight symmetric slowing seen. The patient never enters stage II sleep. No abnormal epileptiform discharges seen during this recording. There was no focal slowing. EKG monitor shows no evidence of cardiac rhythm abnormalities with a heart rate of 72.  Abnormality: None   Impression: This is a normal EEG recording in the waking and drowsy state. No evidence of interictal epileptiform discharges seen. A normal EEG does not exclude a diagnosis of epilepsy.    Windell Norfolk, MD [facility]

## 2022-05-19 ENCOUNTER — Other Ambulatory Visit: Payer: Self-pay

## 2022-05-19 ENCOUNTER — Other Ambulatory Visit (HOSPITAL_COMMUNITY): Payer: Self-pay

## 2022-05-19 MED ORDER — SERTRALINE HCL 25 MG PO TABS
25.0000 mg | ORAL_TABLET | Freq: Every day | ORAL | 0 refills | Status: DC
  Filled 2022-05-19 (×2): qty 90, 90d supply, fill #0

## 2022-06-03 ENCOUNTER — Other Ambulatory Visit (HOSPITAL_COMMUNITY): Payer: Self-pay

## 2022-06-03 MED ORDER — CYCLOBENZAPRINE HCL 10 MG PO TABS
10.0000 mg | ORAL_TABLET | Freq: Three times a day (TID) | ORAL | 0 refills | Status: DC | PRN
  Filled 2022-06-03: qty 30, 10d supply, fill #0

## 2022-06-17 ENCOUNTER — Other Ambulatory Visit (HOSPITAL_COMMUNITY): Payer: Self-pay

## 2022-07-21 ENCOUNTER — Other Ambulatory Visit (HOSPITAL_COMMUNITY): Payer: Self-pay

## 2022-08-11 ENCOUNTER — Other Ambulatory Visit: Payer: Self-pay

## 2022-08-11 MED ORDER — LEVOTHYROXINE SODIUM 25 MCG PO TABS
25.0000 ug | ORAL_TABLET | Freq: Every morning | ORAL | 3 refills | Status: DC
  Filled 2022-08-11 – 2023-03-05 (×3): qty 90, 90d supply, fill #0

## 2022-08-11 MED ORDER — SERTRALINE HCL 100 MG PO TABS
100.0000 mg | ORAL_TABLET | Freq: Every day | ORAL | 3 refills | Status: AC
  Filled 2022-08-11: qty 90, 90d supply, fill #0
  Filled 2023-03-04 (×2): qty 90, 90d supply, fill #1
  Filled 2023-05-26: qty 90, 90d supply, fill #2

## 2022-08-11 MED ORDER — PROPRANOLOL HCL 20 MG PO TABS
20.0000 mg | ORAL_TABLET | Freq: Every day | ORAL | 5 refills | Status: AC
  Filled 2022-08-11: qty 30, 30d supply, fill #0

## 2022-08-11 MED ORDER — HYDROXYZINE PAMOATE 25 MG PO CAPS
25.0000 mg | ORAL_CAPSULE | Freq: Every day | ORAL | 3 refills | Status: AC | PRN
  Filled 2022-08-11: qty 30, 30d supply, fill #0

## 2022-08-11 MED ORDER — ATORVASTATIN CALCIUM 40 MG PO TABS
40.0000 mg | ORAL_TABLET | Freq: Every day | ORAL | 3 refills | Status: AC
  Filled 2022-08-11 – 2022-10-13 (×2): qty 90, 90d supply, fill #0
  Filled 2023-02-03: qty 90, 90d supply, fill #1
  Filled 2023-04-27: qty 90, 90d supply, fill #2
  Filled 2023-07-27 – 2023-08-06 (×2): qty 90, 90d supply, fill #3

## 2022-09-02 ENCOUNTER — Ambulatory Visit: Payer: No Typology Code available for payment source | Attending: Internal Medicine | Admitting: Internal Medicine

## 2022-09-02 ENCOUNTER — Encounter: Payer: Self-pay | Admitting: Internal Medicine

## 2022-09-02 ENCOUNTER — Ambulatory Visit (INDEPENDENT_AMBULATORY_CARE_PROVIDER_SITE_OTHER): Payer: No Typology Code available for payment source

## 2022-09-02 VITALS — BP 108/62 | HR 74 | Ht 67.0 in | Wt 159.2 lb

## 2022-09-02 DIAGNOSIS — R002 Palpitations: Secondary | ICD-10-CM | POA: Diagnosis not present

## 2022-09-02 NOTE — Patient Instructions (Signed)
Medication Instructions:  Your physician recommends that you continue on your current medications as directed. Please refer to the Current Medication list given to you today.  *If you need a refill on your cardiac medications before your next appointment, please call your pharmacy*   Lab Work: NONE If you have labs (blood work) drawn today and your tests are completely normal, you will receive your results only by: MyChart Message (if you have MyChart) OR A paper copy in the mail If you have any lab test that is abnormal or we need to change your treatment, we will call you to review the results.   Testing/Procedures: Christena Deem- Long Term Monitor Instructions  Your physician has requested you wear a ZIO patch monitor for 7 days.  This is a single patch monitor. [employer] supplies one patch monitor per enrollment. Additional stickers are not available. Please do not apply patch if you will be having a Nuclear Stress Test,  Echocardiogram, Cardiac CT, MRI, or Chest Xray during the period you would be wearing the  monitor. The patch cannot be worn during these tests. You cannot remove and re-apply the  ZIO XT patch monitor.  Your ZIO patch monitor will be mailed 3 day USPS to your address on file. It may take 3-5 days  to receive your monitor after you have been enrolled.  Once you have received your monitor, please review the enclosed instructions. Your monitor  has already been registered assigning a specific monitor serial # to you.  Billing and Patient Assistance Program Information  We have supplied [employer] with any of your insurance information on file for billing purposes. [employer] offers a sliding scale Patient Assistance Program for patients that do not have  insurance, or whose insurance does not completely cover the cost of the ZIO monitor.  You must apply for the Patient Assistance Program to qualify for this discounted rate.  To apply, please call [employer] at [phone], select  option 4, select option 2, ask to apply for  Patient Assistance Program. Meredeth Ide will ask your household income, and how many people  are in your household. They will quote your out-of-pocket cost based on that information.  [employer] will also be able to set up a 1-month, interest-free payment plan if needed.  Applying the monitor Shave hair from upper left chest.  Hold abrader disc by orange tab. Rub abrader in 40 strokes over the upper left chest as  indicated in your monitor instructions.  Clean area with 4 enclosed alcohol pads. Let dry.  Apply patch as indicated in monitor instructions. Patch will be placed under collarbone on left  side of chest with arrow pointing upward.  Rub patch adhesive wings for 2 minutes. Remove white label marked "1". Remove the white  label marked "2". Rub patch adhesive wings for 2 additional minutes.  While looking in a mirror, press and release button in center of patch. A small green light will  flash 3-4 times. This will be your only indicator that the monitor has been turned on.  Do not shower for the first 24 hours. You may shower after the first 24 hours.  Press the button if you feel a symptom. You will hear a small click. Record Date, Time and  Symptom in the Patient Logbook.  When you are ready to remove the patch, follow instructions on the last 2 pages of Patient  Logbook. Stick patch monitor onto the last page of Patient Logbook.  Place Patient Logbook in the blue  and white box. Use locking tab on box and tape box closed  securely. The blue and white box has prepaid postage on it. Please place it in the mailbox as  soon as possible. Your physician should have your test results approximately 7 days after the  monitor has been mailed back to [facility].  Call [place] at [phone] if you have questions regarding  your ZIO XT patch monitor. Call them immediately if you see an orange light blinking on your  monitor.   If your monitor falls off in less than 4 days, contact our Monitor department at [phone].  If your monitor becomes loose or falls off after 4 days call [employer] at [phone] for  suggestions on securing your monitor    Follow-Up: At [facility], you and your health needs are our priority.  As part of our continuing mission to provide you with exceptional heart care, we have created designated Provider Care Teams.  These Care Teams include your primary Cardiologist (physician) and Advanced Practice Providers (APPs -  Physician Assistants and Nurse Practitioners) who all work together to provide you with the care you need, when you need it.  We recommend signing up for the patient portal called "MyChart".  Sign up information is provided on this After Visit Summary.  MyChart is used to connect with patients for Virtual Visits (Telemedicine).  Patients are able to view lab/test results, encounter notes, upcoming appointments, etc.  Non-urgent messages can be sent to your provider as well.   To learn more about what you can do with MyChart, go to [email or web address].    Your next appointment:   As Needed   The format for your next appointment:   In Person  Provider:   Janina Mayo, MD

## 2022-09-02 NOTE — Progress Notes (Signed)
Cardiology Office Note:    Date:  09/02/2022   ID:  Tracy Tran, DOB [birth year], MRN [id number]  PCP:  [facility]   [facility] Providers Cardiologist:  Maisie Fus, MD     Referring MD: [facility]   No chief complaint on file. palpitations  History of Present Illness:    Tracy Tran is a 41 y.o. female with a hx of GERD, hashimoto's dx, Raynaud dx, referral for palpitations. She notes some racing heart rates to 190 this was at the dentist office. This has continued to occur. The propanolol has helped 20 mg as needed. No cardiac testing. Has hx of hashimotos, TSH 5.0 08/11/2022. She's taking levothyroxine.  Past Medical History:  Diagnosis Date   Anxiety    GERD (gastroesophageal reflux disease)    TUMS   Hashimoto's disease 2020   Headache    Hypothyroidism    Macromastia    Pregnancy induced hypertension    with previous pregnancy   Raynaud disease    Vaginal Pap smear, abnormal     Past Surgical History:  Procedure Laterality Date   BREAST REDUCTION SURGERY Bilateral 08/15/2020   Procedure: BREAST REDUCTION BILATERAL;  Surgeon: Peggye Form, DO;  Location: [facility];  Service: Plastics;  Laterality: Bilateral;  3.5 hours   CERVICAL BIOPSY  W/ LOOP ELECTRODE EXCISION     WISDOM TOOTH EXTRACTION      Current Medications: Current Outpatient Medications on File Prior to Visit  Medication Sig Dispense Refill   atorvastatin (LIPITOR) 40 MG tablet Take 1 tablet (40 mg total) by mouth daily. 90 tablet 3   hydrOXYzine (VISTARIL) 25 MG capsule Take 1 capsule (25 mg total) by mouth daily as needed. 30 capsule 3   levothyroxine (SYNTHROID) 25 MCG tablet Take 1 tablet (25 mcg total) by mouth in the morning on an empty stomach. 90 tablet 3   Multiple Vitamin (MULTIVITAMIN) tablet Take 1 tablet by mouth daily.     propranolol (INDERAL) 20 MG tablet Take 1 tablet (20 mg total) by mouth daily as  needed. 30 tablet 5   sertraline (ZOLOFT) 100 MG tablet Take 1 tablet (100 mg total) by mouth daily. 90 tablet 3   [DISCONTINUED] rosuvastatin (CRESTOR) 20 MG tablet Take 1 tablet (20 mg total) by mouth daily. 90 tablet 1   No current facility-administered medications on file prior to visit.    Allergies:   Codeine   Social History   Socioeconomic History   Marital status: Married    Spouse name: Larina Earthly   Number of children: Not on file   Years of education: Not on file   Highest education level: Not on file  Occupational History   Not on file  Tobacco Use   Smoking status: Former    Types: Cigarettes    Quit date: 10/31/2017    Years since quitting: 4.8   Smokeless tobacco: Never  Substance and Sexual Activity   Alcohol use: No   Drug use: No   Sexual activity: Yes    Birth control/protection: None    Comment: husband has had vasectomy  Other Topics Concern   Not on file  Social History Narrative   Lives at home with Husband and 3 children   Right Handed   Drinks 1 cup caffeine daily   Social Determinants of Health   Financial Resource Strain: Not on file  Food Insecurity: Not on file  Transportation Needs: Not on  file  Physical Activity: Not on file  Stress: Not on file  Social Connections: Not on file     Family History: The patient's family history includes Diabetes in her maternal grandfather; Heart disease in her father and mother; Hypertension in her father and mother; Thyroid disease in her mother.  Mother- CABG at 1346 Father- CHF at 2555  ROS:   Please see the history of present illness.     All other systems reviewed and are negative.  EKGs/Labs/Other Studies Reviewed:    The following studies were reviewed today:   EKG:  EKG is  ordered today.  The ekg ordered today demonstrates   12/12- NSR with sinus arrhythmia  Recent Labs: No results found for requested labs within last 365 days.  Recent Lipid Panel    Component Value Date/Time   CHOL  174 12/04/2009 1346   TRIG 83.0 12/04/2009 1346   HDL 58.40 12/04/2009 1346   CHOLHDL 3 12/04/2009 1346   VLDL 16.6 12/04/2009 1346   LDLCALC 99 12/04/2009 1346     Risk Assessment/Calculations:         Physical Exam:    VS:   Vitals:   09/02/22 0905  BP: 108/62  Pulse: 74  SpO2: 99%     Wt Readings from Last 3 Encounters:  09/02/22 159 lb 3.2 oz (72.2 kg)  05/13/22 166 lb (75.3 kg)  06/24/21 181 lb (82.1 kg)     GEN:  Well nourished, well developed in no acute distress HEENT: Normal NECK: No JVD; No carotid bruits LYMPHATICS: No lymphadenopathy CARDIAC: RRR, no murmurs, rubs, gallops RESPIRATORY:  Clear to auscultation without rales, wheezing or rhonchi  ABDOMEN: Soft, non-tender, non-distended MUSCULOSKELETAL:  No edema; No deformity  SKIN: Warm and dry NEUROLOGIC:  Alert and oriented x 3 PSYCHIATRIC:  Normal affect   ASSESSMENT:    Palpitations: notes Hrs up to 190. She does not have high risk features including syncope c/f arrhythmia , family hx of SCD, or abnormalities on her EKGs.   We discussed hydration with electrolytes and stress relief. Recommended she take her propanolol  20 mg daily every day to see if this helps. Her TSH is at the upper limit of normal, discussed that this can contribute.   PLAN:    In order of problems listed above:  7 day ziopatch Follow up pending results      Medication Adjustments/Labs and Tests Ordered: Current medicines are reviewed at length with the patient today.  Concerns regarding medicines are outlined above.  Orders Placed This Encounter  Procedures   LONG TERM MONITOR (3-14 DAYS)   EKG 12-Lead   No orders of the defined types were placed in this encounter.   Patient Instructions  Medication Instructions:  Your physician recommends that you continue on your current medications as directed. Please refer to the Current Medication list given to you today.  *If you need a refill on your cardiac medications  before your next appointment, please call your pharmacy*   Lab Work: NONE If you have labs (blood work) drawn today and your tests are completely normal, you will receive your results only by: MyChart Message (if you have MyChart) OR A paper copy in the mail If you have any lab test that is abnormal or we need to change your treatment, we will call you to review the results.   Testing/Procedures: Christena DeemZIO XT- Long Term Monitor Instructions  Your physician has requested you wear a ZIO patch monitor for 7 days.  This is a single patch monitor. [employer] supplies one patch monitor per enrollment. Additional stickers are not available. Please do not apply patch if you will be having a Nuclear Stress Test,  Echocardiogram, Cardiac CT, MRI, or Chest Xray during the period you would be wearing the  monitor. The patch cannot be worn during these tests. You cannot remove and re-apply the  ZIO XT patch monitor.  Your ZIO patch monitor will be mailed 3 day USPS to your address on file. It may take 3-5 days  to receive your monitor after you have been enrolled.  Once you have received your monitor, please review the enclosed instructions. Your monitor  has already been registered assigning a specific monitor serial # to you.  Billing and Patient Assistance Program Information  We have supplied [employer] with any of your insurance information on file for billing purposes. [employer] offers a sliding scale Patient Assistance Program for patients that do not have  insurance, or whose insurance does not completely cover the cost of the ZIO monitor.  You must apply for the Patient Assistance Program to qualify for this discounted rate.  To apply, please call [employer] at [phone], select option 4, select option 2, ask to apply for  Patient Assistance Program. Meredeth Ide will ask your household income, and how many people  are in your household. They will quote your out-of-pocket cost based on that information.   [employer] will also be able to set up a 70-month, interest-free payment plan if needed.  Applying the monitor Shave hair from upper left chest.  Hold abrader disc by orange tab. Rub abrader in 40 strokes over the upper left chest as  indicated in your monitor instructions.  Clean area with 4 enclosed alcohol pads. Let dry.  Apply patch as indicated in monitor instructions. Patch will be placed under collarbone on left  side of chest with arrow pointing upward.  Rub patch adhesive wings for 2 minutes. Remove white label marked "1". Remove the white  label marked "2". Rub patch adhesive wings for 2 additional minutes.  While looking in a mirror, press and release button in center of patch. A small green light will  flash 3-4 times. This will be your only indicator that the monitor has been turned on.  Do not shower for the first 24 hours. You may shower after the first 24 hours.  Press the button if you feel a symptom. You will hear a small click. Record Date, Time and  Symptom in the Patient Logbook.  When you are ready to remove the patch, follow instructions on the last 2 pages of Patient  Logbook. Stick patch monitor onto the last page of Patient Logbook.  Place Patient Logbook in the blue and white box. Use locking tab on box and tape box closed  securely. The blue and white box has prepaid postage on it. Please place it in the mailbox as  soon as possible. Your physician should have your test results approximately 7 days after the  monitor has been mailed back to [facility].  Call [facility] Customer Care at [phone] if you have questions regarding  your ZIO XT patch monitor. Call them immediately if you see an orange light blinking on your  monitor.  If your monitor falls off in less than 4 days, contact our Monitor department at [phone].  If your monitor becomes loose or falls off after 4 days call [employer] at [phone] for  suggestions on securing your monitor  Follow-Up: At [facility], you and your health needs are our priority.  As part of our continuing mission to provide you with exceptional heart care, we have created designated Provider Care Teams.  These Care Teams include your primary Cardiologist (physician) and Advanced Practice Providers (APPs -  Physician Assistants and Nurse Practitioners) who all work together to provide you with the care you need, when you need it.  We recommend signing up for the patient portal called "MyChart".  Sign up information is provided on this After Visit Summary.  MyChart is used to connect with patients for Virtual Visits (Telemedicine).  Patients are able to view lab/test results, encounter notes, upcoming appointments, etc.  Non-urgent messages can be sent to your provider as well.   To learn more about what you can do with MyChart, go to [email or web address].    Your next appointment:   As Needed   The format for your next appointment:   In Person  Provider:   Maisie Fus, MD     Signed, Maisie Fus, MD  09/02/2022 9:34 AM    [facility]

## 2022-09-02 NOTE — Progress Notes (Unsigned)
Enrolled for [employer] to mail a ZIO XT long term holter monitor to the patients address on file.  

## 2022-09-03 DIAGNOSIS — R002 Palpitations: Secondary | ICD-10-CM | POA: Diagnosis not present

## 2022-09-16 ENCOUNTER — Other Ambulatory Visit: Payer: Self-pay

## 2022-09-16 MED ORDER — OSELTAMIVIR PHOSPHATE 75 MG PO CAPS
75.0000 mg | ORAL_CAPSULE | Freq: Every day | ORAL | 0 refills | Status: AC
  Filled 2022-09-16: qty 10, 10d supply, fill #0

## 2022-09-24 DIAGNOSIS — Z113 Encounter for screening for infections with a predominantly sexual mode of transmission: Secondary | ICD-10-CM | POA: Diagnosis not present

## 2022-09-24 DIAGNOSIS — Z124 Encounter for screening for malignant neoplasm of cervix: Secondary | ICD-10-CM | POA: Diagnosis not present

## 2022-09-24 DIAGNOSIS — Z13220 Encounter for screening for lipoid disorders: Secondary | ICD-10-CM | POA: Diagnosis not present

## 2022-09-24 DIAGNOSIS — Z6825 Body mass index (BMI) 25.0-25.9, adult: Secondary | ICD-10-CM | POA: Diagnosis not present

## 2022-09-24 DIAGNOSIS — Z131 Encounter for screening for diabetes mellitus: Secondary | ICD-10-CM | POA: Diagnosis not present

## 2022-09-24 DIAGNOSIS — Z13228 Encounter for screening for other metabolic disorders: Secondary | ICD-10-CM | POA: Diagnosis not present

## 2022-09-24 DIAGNOSIS — Z130 Encounter for screening for diseases of the blood and blood-forming organs and certain disorders involving the immune mechanism: Secondary | ICD-10-CM | POA: Diagnosis not present

## 2022-09-24 DIAGNOSIS — Z1321 Encounter for screening for nutritional disorder: Secondary | ICD-10-CM | POA: Diagnosis not present

## 2022-09-24 DIAGNOSIS — Z01419 Encounter for gynecological examination (general) (routine) without abnormal findings: Secondary | ICD-10-CM | POA: Diagnosis not present

## 2022-09-24 DIAGNOSIS — Z1329 Encounter for screening for other suspected endocrine disorder: Secondary | ICD-10-CM | POA: Diagnosis not present

## 2022-10-03 ENCOUNTER — Other Ambulatory Visit (HOSPITAL_COMMUNITY): Payer: Self-pay

## 2022-10-03 DIAGNOSIS — R29898 Other symptoms and signs involving the musculoskeletal system: Secondary | ICD-10-CM | POA: Diagnosis not present

## 2022-10-03 DIAGNOSIS — E063 Autoimmune thyroiditis: Secondary | ICD-10-CM | POA: Diagnosis not present

## 2022-10-03 MED ORDER — PREDNISONE 10 MG PO TABS
ORAL_TABLET | ORAL | 0 refills | Status: AC
  Filled 2022-10-03: qty 42, 12d supply, fill #0

## 2022-10-07 ENCOUNTER — Other Ambulatory Visit (HOSPITAL_COMMUNITY): Payer: Self-pay

## 2022-10-07 MED ORDER — LEVOTHYROXINE SODIUM 25 MCG PO TABS
25.0000 ug | ORAL_TABLET | Freq: Every morning | ORAL | 3 refills | Status: DC
  Filled 2022-10-07: qty 90, 90d supply, fill #0

## 2022-10-13 ENCOUNTER — Other Ambulatory Visit: Payer: Self-pay

## 2022-11-11 ENCOUNTER — Other Ambulatory Visit (HOSPITAL_COMMUNITY): Payer: Self-pay

## 2022-11-11 DIAGNOSIS — F419 Anxiety disorder, unspecified: Secondary | ICD-10-CM | POA: Diagnosis not present

## 2022-11-11 DIAGNOSIS — E063 Autoimmune thyroiditis: Secondary | ICD-10-CM | POA: Diagnosis not present

## 2022-11-11 DIAGNOSIS — E782 Mixed hyperlipidemia: Secondary | ICD-10-CM | POA: Diagnosis not present

## 2022-11-11 MED ORDER — LEVOTHYROXINE SODIUM 25 MCG PO TABS
37.5000 ug | ORAL_TABLET | Freq: Every morning | ORAL | 3 refills | Status: DC
  Filled 2022-11-11 – 2022-12-05 (×2): qty 135, 90d supply, fill #0
  Filled 2023-03-04: qty 45, 30d supply, fill #1
  Filled 2023-03-04: qty 135, 90d supply, fill #1
  Filled 2023-05-26: qty 135, 90d supply, fill #2
  Filled 2023-08-13: qty 135, 90d supply, fill #0

## 2022-11-11 MED ORDER — HYDROXYZINE PAMOATE 25 MG PO CAPS
25.0000 mg | ORAL_CAPSULE | Freq: Every evening | ORAL | 3 refills | Status: AC | PRN
  Filled 2022-11-11: qty 90, 90d supply, fill #0

## 2022-11-11 MED ORDER — ATORVASTATIN CALCIUM 40 MG PO TABS
40.0000 mg | ORAL_TABLET | Freq: Every day | ORAL | 3 refills | Status: DC
  Filled 2023-10-29 (×2): qty 90, 90d supply, fill #0

## 2022-11-11 MED ORDER — SERTRALINE HCL 100 MG PO TABS
100.0000 mg | ORAL_TABLET | Freq: Every day | ORAL | 3 refills | Status: DC
  Filled 2022-11-11: qty 90, 90d supply, fill #0
  Filled 2023-08-24 – 2023-09-23 (×4): qty 90, 90d supply, fill #1

## 2022-11-11 MED ORDER — PROPRANOLOL HCL 20 MG PO TABS
20.0000 mg | ORAL_TABLET | Freq: Every day | ORAL | 3 refills | Status: AC
  Filled 2022-11-11: qty 90, 90d supply, fill #0

## 2022-11-18 ENCOUNTER — Other Ambulatory Visit (HOSPITAL_COMMUNITY): Payer: Self-pay

## 2022-11-19 ENCOUNTER — Ambulatory Visit: Payer: No Typology Code available for payment source | Admitting: Neurology

## 2022-11-20 ENCOUNTER — Encounter (HOSPITAL_COMMUNITY): Payer: Self-pay

## 2022-11-20 ENCOUNTER — Other Ambulatory Visit: Payer: Self-pay

## 2022-11-20 ENCOUNTER — Emergency Department (HOSPITAL_COMMUNITY)
Admission: EM | Admit: 2022-11-20 | Discharge: 2022-11-20 | Discharge status: Against Medical Advice | Payer: 59 | Attending: Emergency Medicine | Admitting: Emergency Medicine

## 2022-11-20 DIAGNOSIS — R1013 Epigastric pain: Secondary | ICD-10-CM | POA: Diagnosis not present

## 2022-11-20 DIAGNOSIS — R1011 Right upper quadrant pain: Secondary | ICD-10-CM | POA: Diagnosis not present

## 2022-11-20 DIAGNOSIS — Z5321 Procedure and treatment not carried out due to patient leaving prior to being seen by health care provider: Secondary | ICD-10-CM | POA: Insufficient documentation

## 2022-11-20 LAB — URINALYSIS, ROUTINE W REFLEX MICROSCOPIC
Bacteria, UA: NONE SEEN
Bilirubin Urine: NEGATIVE
Glucose, UA: NEGATIVE mg/dL
Ketones, ur: NEGATIVE mg/dL
Leukocytes,Ua: NEGATIVE
Nitrite: NEGATIVE
Protein, ur: NEGATIVE mg/dL
Specific Gravity, Urine: 1.01 (ref 1.005–1.030)
pH: 6 (ref 5.0–8.0)

## 2022-11-20 LAB — CBC WITH DIFFERENTIAL/PLATELET
Abs Immature Granulocytes: 0.02 10*3/uL (ref 0.00–0.07)
Basophils Absolute: 0 10*3/uL (ref 0.0–0.1)
Basophils Relative: 1 %
Eosinophils Absolute: 0.1 10*3/uL (ref 0.0–0.5)
Eosinophils Relative: 1 %
HCT: 41.8 % (ref 36.0–46.0)
Hemoglobin: 14 g/dL (ref 12.0–15.0)
Immature Granulocytes: 0 %
Lymphocytes Relative: 31 %
Lymphs Abs: 1.8 10*3/uL (ref 0.7–4.0)
MCH: 30.4 pg (ref 26.0–34.0)
MCHC: 33.5 g/dL (ref 30.0–36.0)
MCV: 90.7 fL (ref 80.0–100.0)
Monocytes Absolute: 0.3 10*3/uL (ref 0.1–1.0)
Monocytes Relative: 6 %
Neutro Abs: 3.7 10*3/uL (ref 1.7–7.7)
Neutrophils Relative %: 61 %
Platelets: 272 10*3/uL (ref 150–400)
RBC: 4.61 MIL/uL (ref 3.87–5.11)
RDW: 12.4 % (ref 11.5–15.5)
WBC: 6 10*3/uL (ref 4.0–10.5)
nRBC: 0 % (ref 0.0–0.2)

## 2022-11-20 LAB — COMPREHENSIVE METABOLIC PANEL
ALT: 17 U/L (ref 0–44)
AST: 24 U/L (ref 15–41)
Albumin: 4.2 g/dL (ref 3.5–5.0)
Alkaline Phosphatase: 48 U/L (ref 38–126)
Anion gap: 11 (ref 5–15)
BUN: 9 mg/dL (ref 6–20)
CO2: 25 mmol/L (ref 22–32)
Calcium: 9 mg/dL (ref 8.9–10.3)
Chloride: 103 mmol/L (ref 98–111)
Creatinine, Ser: 0.86 mg/dL (ref 0.44–1.00)
GFR, Estimated: >60 mL/min (ref >60)
Glucose, Bld: 91 mg/dL (ref 70–99)
Potassium: 3.9 mmol/L (ref 3.5–5.1)
Sodium: 139 mmol/L (ref 135–145)
Total Bilirubin: 0.4 mg/dL (ref 0.3–1.2)
Total Protein: 6.6 g/dL (ref 6.5–8.1)

## 2022-11-20 LAB — I-STAT BETA HCG BLOOD, ED (MC, WL, AP ONLY): I-stat hCG, quantitative: <5 m[IU]/mL (ref <5)

## 2022-11-20 LAB — LIPASE, BLOOD: Lipase: 39 U/L (ref 11–51)

## 2022-11-20 NOTE — ED Triage Notes (Signed)
Pt c/o epigastric pain that "feels like a contraction.: Pt states it's worse after she eatsx5d. Pt denies N/V

## 2022-11-20 NOTE — ED Notes (Signed)
Pt called for room x3. No response. Pt removed from floor.

## 2022-11-20 NOTE — ED Provider Triage Note (Signed)
Emergency Medicine Provider Triage Evaluation Note  Tracy Tran , a 42 y.o. female  was evaluated in triage.  Pt complains of epigastric tenderness.  She reports that she has noticed after meals she will typically have severe sharp pain in her epigastric region.  She also reports some right upper quadrant pain and tenderness with radiation into the epigastric space.  No prior history of any gallbladder abnormalities or pancreatic issues.  Denies any fevers, nausea, vomiting, diarrhea.  No chest pain, shortness of breath, weakness, dizziness.  No heavy alcohol use, no heavy NSAID use.  Review of Systems  Positive: As above Negative: As above  Physical Exam  BP (!) 149/94 (BP Location: Right Arm)   Pulse 94   Temp 98.8 F (37.1 C) (Oral)   Resp 16   Ht '5\' 7"'$  (1.702 m)   Wt 72.2 kg   SpO2 100%   BMI 24.93 kg/m  Gen:   Awake, no distress   Resp:  Normal effort  MSK:   Moves extremities without difficulty  Other:  TTP in RUQ and epigastric regions Medical Decision Making  Medically screening exam initiated at 12:11 PM.  Appropriate orders placed.  JOYANN TIVIS was informed that the remainder of the evaluation will be completed by another provider, this initial triage assessment does not replace that evaluation, and the importance of remaining in the ED until their evaluation is complete.     Luvenia Heller, PA-C 11/20/22 1212

## 2022-12-05 ENCOUNTER — Other Ambulatory Visit (HOSPITAL_COMMUNITY): Payer: Self-pay

## 2023-01-22 ENCOUNTER — Emergency Department (HOSPITAL_BASED_OUTPATIENT_CLINIC_OR_DEPARTMENT_OTHER): Admission: EM | Admit: 2023-01-22 | Payer: 59 | Source: Home / Self Care

## 2023-02-03 ENCOUNTER — Other Ambulatory Visit: Payer: Self-pay

## 2023-03-03 ENCOUNTER — Other Ambulatory Visit: Payer: Self-pay

## 2023-03-04 ENCOUNTER — Other Ambulatory Visit: Payer: Self-pay

## 2023-03-05 ENCOUNTER — Other Ambulatory Visit: Payer: Self-pay

## 2023-05-26 ENCOUNTER — Other Ambulatory Visit: Payer: Self-pay

## 2023-05-27 ENCOUNTER — Other Ambulatory Visit: Payer: Self-pay

## 2023-08-05 ENCOUNTER — Other Ambulatory Visit: Payer: Self-pay

## 2023-08-07 ENCOUNTER — Other Ambulatory Visit: Payer: Self-pay

## 2023-08-13 ENCOUNTER — Other Ambulatory Visit: Payer: Self-pay

## 2023-08-26 ENCOUNTER — Other Ambulatory Visit: Payer: Self-pay

## 2023-09-09 ENCOUNTER — Other Ambulatory Visit: Payer: Self-pay

## 2023-09-22 ENCOUNTER — Other Ambulatory Visit: Payer: Self-pay

## 2023-09-24 ENCOUNTER — Other Ambulatory Visit: Payer: Self-pay

## 2023-10-06 ENCOUNTER — Other Ambulatory Visit: Payer: Self-pay

## 2023-10-29 ENCOUNTER — Other Ambulatory Visit (HOSPITAL_COMMUNITY): Payer: Self-pay

## 2023-10-29 ENCOUNTER — Other Ambulatory Visit: Payer: Self-pay

## 2023-10-29 MED ORDER — LEVOTHYROXINE SODIUM 25 MCG PO TABS
37.5000 ug | ORAL_TABLET | Freq: Every morning | ORAL | 0 refills | Status: DC
  Filled 2023-10-29 – 2023-11-05 (×3): qty 135, 90d supply, fill #0

## 2023-11-04 ENCOUNTER — Other Ambulatory Visit: Payer: Self-pay

## 2023-11-05 ENCOUNTER — Other Ambulatory Visit: Payer: Self-pay

## 2023-11-12 ENCOUNTER — Other Ambulatory Visit: Payer: Self-pay

## 2023-11-12 MED ORDER — SERTRALINE HCL 100 MG PO TABS
100.0000 mg | ORAL_TABLET | Freq: Every day | ORAL | 0 refills | Status: DC
  Filled 2023-11-12: qty 90, 90d supply, fill #0

## 2023-11-19 ENCOUNTER — Other Ambulatory Visit (HOSPITAL_COMMUNITY): Payer: Self-pay

## 2023-11-19 DIAGNOSIS — E039 Hypothyroidism, unspecified: Secondary | ICD-10-CM | POA: Diagnosis not present

## 2023-11-19 DIAGNOSIS — F419 Anxiety disorder, unspecified: Secondary | ICD-10-CM | POA: Diagnosis not present

## 2023-11-19 DIAGNOSIS — Z0000 Encounter for general adult medical examination without abnormal findings: Secondary | ICD-10-CM | POA: Diagnosis not present

## 2023-11-19 DIAGNOSIS — Z79899 Other long term (current) drug therapy: Secondary | ICD-10-CM | POA: Diagnosis not present

## 2023-11-19 DIAGNOSIS — E782 Mixed hyperlipidemia: Secondary | ICD-10-CM | POA: Diagnosis not present

## 2023-11-19 MED ORDER — LEVOTHYROXINE SODIUM 25 MCG PO TABS
37.5000 ug | ORAL_TABLET | Freq: Every morning | ORAL | 3 refills | Status: AC
  Filled 2024-02-16 – 2024-07-05 (×2): qty 135, 90d supply, fill #0
  Filled 2024-09-21: qty 135, 90d supply, fill #1

## 2023-11-19 MED ORDER — SERTRALINE HCL 100 MG PO TABS: 100.0000 mg | ORAL_TABLET | Freq: Every day | ORAL | 1 refills | Status: AC

## 2023-11-19 MED ORDER — ATORVASTATIN CALCIUM 40 MG PO TABS
40.0000 mg | ORAL_TABLET | Freq: Every day | ORAL | 3 refills | Status: AC
  Filled 2024-01-27 – 2024-02-24 (×2): qty 90, 90d supply, fill #0
  Filled 2024-05-27 (×2): qty 90, 90d supply, fill #1
  Filled 2024-08-31: qty 90, 90d supply, fill #0
  Filled 2024-08-31: qty 90, 90d supply, fill #2

## 2023-11-19 MED ORDER — PROPRANOLOL HCL 20 MG PO TABS: 20.0000 mg | ORAL_TABLET | Freq: Every day | ORAL | 1 refills | Status: AC | PRN

## 2023-11-20 ENCOUNTER — Other Ambulatory Visit (HOSPITAL_COMMUNITY): Payer: Self-pay

## 2023-11-20 MED ORDER — LEVOTHYROXINE SODIUM 88 MCG PO TABS
44.0000 ug | ORAL_TABLET | Freq: Every morning | ORAL | 3 refills | Status: AC
  Filled 2023-11-20 – 2023-12-03 (×2): qty 45, 90d supply, fill #0
  Filled 2024-05-27 (×2): qty 45, 90d supply, fill #1

## 2023-11-20 MED ORDER — LEVOTHYROXINE SODIUM 88 MCG PO TABS
44.0000 ug | ORAL_TABLET | Freq: Every morning | ORAL | 3 refills | Status: AC
  Filled 2023-11-20 – 2024-02-16 (×3): qty 45, 90d supply, fill #0

## 2023-12-02 ENCOUNTER — Other Ambulatory Visit (HOSPITAL_COMMUNITY): Payer: Self-pay

## 2023-12-03 ENCOUNTER — Other Ambulatory Visit: Payer: Self-pay

## 2024-01-27 ENCOUNTER — Other Ambulatory Visit: Payer: Self-pay

## 2024-02-16 ENCOUNTER — Other Ambulatory Visit: Payer: Self-pay

## 2024-02-16 DIAGNOSIS — Z124 Encounter for screening for malignant neoplasm of cervix: Secondary | ICD-10-CM | POA: Diagnosis not present

## 2024-02-16 DIAGNOSIS — Z6826 Body mass index (BMI) 26.0-26.9, adult: Secondary | ICD-10-CM | POA: Diagnosis not present

## 2024-02-16 DIAGNOSIS — Z01419 Encounter for gynecological examination (general) (routine) without abnormal findings: Secondary | ICD-10-CM | POA: Diagnosis not present

## 2024-02-16 DIAGNOSIS — N920 Excessive and frequent menstruation with regular cycle: Secondary | ICD-10-CM | POA: Diagnosis not present

## 2024-02-16 DIAGNOSIS — K629 Disease of anus and rectum, unspecified: Secondary | ICD-10-CM | POA: Diagnosis not present

## 2024-02-24 ENCOUNTER — Other Ambulatory Visit: Payer: Self-pay

## 2024-03-16 ENCOUNTER — Encounter: Payer: Self-pay | Admitting: Obstetrics and Gynecology

## 2024-04-08 DIAGNOSIS — N760 Acute vaginitis: Secondary | ICD-10-CM | POA: Diagnosis not present

## 2024-04-08 DIAGNOSIS — N898 Other specified noninflammatory disorders of vagina: Secondary | ICD-10-CM | POA: Diagnosis not present

## 2024-04-08 DIAGNOSIS — R350 Frequency of micturition: Secondary | ICD-10-CM | POA: Diagnosis not present

## 2024-04-08 DIAGNOSIS — N819 Female genital prolapse, unspecified: Secondary | ICD-10-CM | POA: Diagnosis not present

## 2024-05-27 ENCOUNTER — Other Ambulatory Visit: Payer: Self-pay

## 2024-06-02 ENCOUNTER — Encounter: Payer: Self-pay | Admitting: Gastroenterology

## 2024-06-08 DIAGNOSIS — E063 Autoimmune thyroiditis: Secondary | ICD-10-CM | POA: Diagnosis not present

## 2024-06-08 DIAGNOSIS — E039 Hypothyroidism, unspecified: Secondary | ICD-10-CM | POA: Diagnosis not present

## 2024-06-27 DIAGNOSIS — H60502 Unspecified acute noninfective otitis externa, left ear: Secondary | ICD-10-CM | POA: Diagnosis not present

## 2024-06-27 DIAGNOSIS — J310 Chronic rhinitis: Secondary | ICD-10-CM | POA: Diagnosis not present

## 2024-06-27 DIAGNOSIS — Z6825 Body mass index (BMI) 25.0-25.9, adult: Secondary | ICD-10-CM | POA: Diagnosis not present

## 2024-07-05 ENCOUNTER — Other Ambulatory Visit: Payer: Self-pay

## 2024-07-20 DIAGNOSIS — E039 Hypothyroidism, unspecified: Secondary | ICD-10-CM | POA: Diagnosis not present

## 2024-07-26 ENCOUNTER — Ambulatory Visit: Admitting: Gastroenterology

## 2024-08-31 ENCOUNTER — Other Ambulatory Visit: Payer: Self-pay

## 2024-09-01 ENCOUNTER — Other Ambulatory Visit: Payer: Self-pay

## 2024-09-12 ENCOUNTER — Encounter: Payer: Self-pay | Admitting: Gastroenterology

## 2024-09-12 ENCOUNTER — Ambulatory Visit (INDEPENDENT_AMBULATORY_CARE_PROVIDER_SITE_OTHER): Admitting: Gastroenterology

## 2024-09-12 ENCOUNTER — Other Ambulatory Visit

## 2024-09-12 VITALS — BP 126/66 | HR 91 | Ht 67.0 in | Wt 166.0 lb

## 2024-09-12 DIAGNOSIS — R194 Change in bowel habit: Secondary | ICD-10-CM

## 2024-09-12 DIAGNOSIS — R197 Diarrhea, unspecified: Secondary | ICD-10-CM

## 2024-09-12 DIAGNOSIS — R1310 Dysphagia, unspecified: Secondary | ICD-10-CM | POA: Diagnosis not present

## 2024-09-12 DIAGNOSIS — R1319 Other dysphagia: Secondary | ICD-10-CM

## 2024-09-12 DIAGNOSIS — R140 Abdominal distension (gaseous): Secondary | ICD-10-CM

## 2024-09-12 DIAGNOSIS — K5900 Constipation, unspecified: Secondary | ICD-10-CM | POA: Diagnosis not present

## 2024-09-12 DIAGNOSIS — K629 Disease of anus and rectum, unspecified: Secondary | ICD-10-CM | POA: Diagnosis not present

## 2024-09-12 DIAGNOSIS — K219 Gastro-esophageal reflux disease without esophagitis: Secondary | ICD-10-CM | POA: Diagnosis not present

## 2024-09-12 LAB — CBC WITH DIFFERENTIAL/PLATELET
Basophils Absolute: 0 K/uL (ref 0.0–0.1)
Basophils Relative: 0.5 % (ref 0.0–3.0)
Eosinophils Absolute: 0 K/uL (ref 0.0–0.7)
Eosinophils Relative: 0.4 % (ref 0.0–5.0)
HCT: 42.3 % (ref 36.0–46.0)
Hemoglobin: 14.5 g/dL (ref 12.0–15.0)
Lymphocytes Relative: 18.2 % (ref 12.0–46.0)
Lymphs Abs: 1.2 K/uL (ref 0.7–4.0)
MCHC: 34.3 g/dL (ref 30.0–36.0)
MCV: 88.9 fl (ref 78.0–100.0)
Monocytes Absolute: 0.5 K/uL (ref 0.1–1.0)
Monocytes Relative: 6.9 % (ref 3.0–12.0)
Neutro Abs: 4.9 K/uL (ref 1.4–7.7)
Neutrophils Relative %: 74 % (ref 43.0–77.0)
Platelets: 253 K/uL (ref 150.0–400.0)
RBC: 4.76 Mil/uL (ref 3.87–5.11)
RDW: 12.7 % (ref 11.5–15.5)
WBC: 6.6 K/uL (ref 4.0–10.5)

## 2024-09-12 LAB — COMPREHENSIVE METABOLIC PANEL WITH GFR
ALT: 18 U/L (ref 3–35)
AST: 22 U/L (ref 5–37)
Albumin: 4.7 g/dL (ref 3.5–5.2)
Alkaline Phosphatase: 47 U/L (ref 39–117)
BUN: 17 mg/dL (ref 6–23)
CO2: 28 meq/L (ref 19–32)
Calcium: 9.3 mg/dL (ref 8.4–10.5)
Chloride: 104 meq/L (ref 96–112)
Creatinine, Ser: 0.74 mg/dL (ref 0.40–1.20)
GFR: 99.99 mL/min
Glucose, Bld: 92 mg/dL (ref 70–99)
Potassium: 4.2 meq/L (ref 3.5–5.1)
Sodium: 139 meq/L (ref 135–145)
Total Bilirubin: 0.3 mg/dL (ref 0.2–1.2)
Total Protein: 6.8 g/dL (ref 6.0–8.3)

## 2024-09-12 LAB — SEDIMENTATION RATE: Sed Rate: 5 mm/h (ref 0–20)

## 2024-09-12 LAB — C-REACTIVE PROTEIN: CRP: <0.5 mg/dL — ABNORMAL LOW (ref 1.0–20.0)

## 2024-09-12 MED ORDER — NA SULFATE-K SULFATE-MG SULF 17.5-3.13-1.6 GM/177ML PO SOLN: 1.0000 | ORAL | 0 refills | Status: AC

## 2024-09-12 NOTE — Progress Notes (Signed)
 "  Tracy Tran [id number] December 08, 1980   Chief Complaint: Diarrhea, anal blackheads  Referring Provider: [facility] Primary GI MD: Sampson  HPI: Tracy Tran is a 43 y.o. female with past medical history of anxiety, GERD, Hashimoto's, hypothyroidism, Raynaud's disease who presents today for a complaint of diarrhea and anal blackheads.    Patient is referred by OB/GYN for evaluation of anal lesions.  Labs 11/20/2022: Normal CBC, CMP, lipase   Discussed the use of AI scribe software for clinical note transcription with the patient, who gave verbal consent to proceed.  History of Present Illness Tracy Tran is a 43 year old female with Hashimoto's thyroiditis who presents for evaluation of chronic gastrointestinal symptoms.  Altered bowel habits - Daily morning diarrhea alternating with periods of constipation for the past year - No regularity in bowel habits - No prior stool testing for infection - No history of abdominal surgeries  Abdominal bloating and distension - Significant bloating, especially after eating - Abdominal distension described as 'looking six months pregnant' after gluten exposure - Rapid overnight weight fluctuations up to five pounds, attributed to bloating - Bloating worsens throughout the day and improves overnight - Considering elimination of dairy and gluten; currently consumes dairy - Gluten-free diet previously improved symptoms; accidental gluten exposure leads to pronounced symptoms - Probiotics trialed, resulting in worsened constipation - No other abdominal pain except mild sensitivity around the umbilical area  Gastroesophageal reflux and dysphagia - Heartburn approximately twice per week - Avoids proton pump inhibitors due to concerns about levothyroxine  absorption - Occasional dysphagia, more pronounced during periods of anxiety - Sensation of food 'just dropping' in the throat, sometimes requiring spitting out  food/regurgitation - No current frequent trouble swallowing, chest pain, or shortness of breath  Perianal lesions - Referred after OB/GYN identified anal blackheads during evaluation for a bumpy sensation in the anal area - No discomfort, irritation, rectal pain, or rectal bleeding - Area is not bothersome but feels different in texture    No prior colonoscopy or EGD.  No known family history of celiac disease, IBD, colon cancer.  Previous GI Procedures/Imaging      Past Medical History:  Diagnosis Date   Anxiety    GERD (gastroesophageal reflux disease)    TUMS   Hashimoto's disease 2020   Headache    Hypothyroidism    Macromastia    Pregnancy induced hypertension    with previous pregnancy   Raynaud disease    Vaginal Pap smear, abnormal     Past Surgical History:  Procedure Laterality Date   BREAST REDUCTION SURGERY Bilateral 08/15/2020   Procedure: BREAST REDUCTION BILATERAL;  Surgeon: Lowery Estefana RAMAN, DO;  Location: [facility];  Service: Plastics;  Laterality: Bilateral;  3.5 hours   CERVICAL BIOPSY  W/ LOOP ELECTRODE EXCISION     WISDOM TOOTH EXTRACTION      Current Outpatient Medications  Medication Sig Dispense Refill   atorvastatin  (LIPITOR) 40 MG tablet Take 1 tablet (40 mg total) by mouth daily. 90 tablet 3   hydrOXYzine  (VISTARIL ) 25 MG capsule Take 1 capsule (25 mg total) by mouth daily as needed. 30 capsule 3   Multiple Vitamin (MULTIVITAMIN) tablet Take 1 tablet by mouth daily.     propranolol  (INDERAL ) 20 MG tablet Take 1 tablet (20 mg total) by mouth daily as needed. 30 tablet 5   atorvastatin  (LIPITOR) 40 MG tablet Take 1 tablet (40 mg total) by mouth daily. 90 tablet 3  hydrOXYzine  (VISTARIL ) 25 MG capsule Take 1 capsule (25 mg total) by mouth at bedtime as needed. 90 capsule 3   levothyroxine  (SYNTHROID ) 25 MCG tablet Take 1.5 tablets (37.5 mcg total) by mouth in the morning on an empty stomach. 135 tablet 3   levothyroxine   (SYNTHROID ) 88 MCG tablet Take 0.5 tablets (44 mcg total) by mouth every morning before breakfast on an empty stomach. 90 tablet 3   levothyroxine  (SYNTHROID ) 88 MCG tablet Take 1/2 tablet (44 mcg total) by mouth every morning on an empty stomach. 45 tablet 3   oseltamivir  (TAMIFLU ) 75 MG capsule Take 1 capsule (75 mg total) by mouth daily. 10 capsule 0   propranolol  (INDERAL ) 20 MG tablet Take 1 tablet (20 mg total) by mouth daily as needed 90 tablet 3   propranolol  (INDERAL ) 20 MG tablet Take 1 tablet (20 mg total) by mouth daily as needed. 90 tablet 1   sertraline  (ZOLOFT ) 100 MG tablet Take 1 tablet (100 mg total) by mouth daily. 90 tablet 3   sertraline  (ZOLOFT ) 100 MG tablet Take 1 tablet (100 mg total) by mouth daily. 90 tablet 1   No current facility-administered medications for this visit.    Allergies as of 09/12/2024 - Review Complete 09/12/2024  Allergen Reaction Noted   Codeine Other (See Comments) 06/01/2020    Family History  Problem Relation Age of Onset   Heart disease Father    Hypertension Father    Heart disease Mother    Hypertension Mother    Thyroid  disease Mother    Diabetes Maternal Grandfather     Social History[1]   Review of Systems:    Constitutional: No unintentional weight loss, fever, chills Skin: No rash or itching Cardiovascular: No chest pain Respiratory: No SOB  Gastrointestinal: See HPI and otherwise negative Musculoskeletal: No new muscle or joint pain   Physical Exam:  Vital signs: BP 126/66   Pulse 91   Ht 5' 7 (1.702 m)   Wt 166 lb (75.3 kg)   BMI 26.00 kg/m   Constitutional: Pleasant, well-appearing female in NAD, alert and cooperative Head:  Normocephalic and atraumatic.  Respiratory: Respirations even and unlabored. Lungs clear to auscultation bilaterally.  No wheezes, crackles, or rhonchi.  Cardiovascular:  Regular rate and rhythm. No murmurs. No peripheral edema. Gastrointestinal:  Soft, nondistended, minimally tender  around periumbilical area. No rebound or guarding. Normal bowel sounds. No appreciable masses or hepatomegaly. Rectal: A few perianal comedones noted.  Perianal skin tag.  No hemorrhoids or fissures.  No bleeding.  Normal rectal tone.  Chaperone present for exam. Neurologic:  Alert and oriented x4;  grossly normal neurologically.  Skin:   Dry and intact without significant lesions or rashes. Psychiatric: Oriented to person, place and time. Demonstrates good judgement and reason without abnormal affect or behaviors.   RELEVANT LABS AND IMAGING: CBC    Component Value Date/Time   WBC 6.0 11/20/2022 1140   RBC 4.61 11/20/2022 1140   HGB 14.0 11/20/2022 1140   HCT 41.8 11/20/2022 1140   PLT 272 11/20/2022 1140   MCV 90.7 11/20/2022 1140   MCH 30.4 11/20/2022 1140   MCHC 33.5 11/20/2022 1140   RDW 12.4 11/20/2022 1140   LYMPHSABS 1.8 11/20/2022 1140   MONOABS 0.3 11/20/2022 1140   EOSABS 0.1 11/20/2022 1140   BASOSABS 0.0 11/20/2022 1140    CMP     Component Value Date/Time   NA 139 11/20/2022 1140   K 3.9 11/20/2022 1140   CL 103  11/20/2022 1140   CO2 25 11/20/2022 1140   GLUCOSE 91 11/20/2022 1140   BUN 9 11/20/2022 1140   CREATININE 0.86 11/20/2022 1140   CALCIUM  9.0 11/20/2022 1140   PROT 6.6 11/20/2022 1140   ALBUMIN 4.2 11/20/2022 1140   AST 24 11/20/2022 1140   ALT 17 11/20/2022 1140   ALKPHOS 48 11/20/2022 1140   BILITOT 0.4 11/20/2022 1140   GFRNONAA >60 11/20/2022 1140   GFRAA >60 03/02/2018 1142     Assessment/Plan:   Assessment & Plan Anal lesion Appears to have perianal comedones on exam today, benign appearance.  Also has a perianal skin tag at the posterior midline.  No hemorrhoids or fissures noted.  No visible bleeding.  Not having any perianal irritation or pain, but has noticed change in perianal texture.  - Further evaluation with upcoming colonoscopy.  Change in bowel habits Diarrhea Abdominal bloating Constipation Chronic, fluctuating bowel  habits with bloating and weight changes, possibly functional etiology.  In the last year has predominantly had diarrhea daily.  Follows a gluten-free diet most of the time, but if she does consume gluten she will have significant abdominal bloating.  No prior testing for celiac disease and no known family history of celiac disease.   She had attributed her diarrhea to Hashimoto's, but recently has had stable TSH. Given her change in bowel habits and persistent symptoms over the last year we discussed further evaluation with labs, stool studies, colonoscopy.  - Labs today: CBC, CMP, ESR, CRP, TTG, IgA (omitting TSH as this has been checked recently per patient and normal) - Stool studies: Stool culture, C. difficile, O&P - Discussed low FODMAP diet and provided educational handout. - Discussed gluten and dairy elimination trial. - Recommended discontinuation of probiotic due to lack of benefit and possible exacerbation of constipation. - Schedule colonoscopy. I thoroughly discussed the procedure with the patient to include nature of the procedure, alternatives, benefits, and risks (including but not limited to bleeding, infection, perforation, anesthesia/cardiac/pulmonary complications). Patient verbalized understanding and gave verbal consent to proceed with procedure.  - If above workup is negative consider empiric treatment for IBS-D/SIBO with Xifaxan.  Gastroesophageal reflux disease Dysphagia  Mild, intermittent symptoms with occasional dysphagia, improved with thyroid  management. She prefers to avoid proton pump inhibitor therapy due to concerns regarding levothyroxine  absorption.  Having heartburn/reflux about twice a week as well as intermittent esophageal dysphagia and occasional regurgitation.  Has been worse in the past but still present.  No prior EGD or swallowing evaluation.  - Schedule upper endoscopy to be done at time of colonoscopy to evaluate for esophageal irritation, strictures,  or narrowing.  - Lifestyle modifications for GERD - Consider use of PPI or H2RA pending findings on endoscopy   Camie Furbish, PA-C [facility] 09/12/2024, 9:37 AM  Patient Care Team: [facility] as PCP - General (Family Medicine) Alvan Ronal BRAVO, MD (Inactive) as PCP - Cardiology (Cardiology)       [1]  Social History Tobacco Use   Smoking status: Former    Current packs/day: 0.00    Types: Cigarettes    Quit date: 10/31/2017    Years since quitting: 6.8   Smokeless tobacco: Never  Substance Use Topics   Alcohol use: No   Drug use: No   "

## 2024-09-12 NOTE — Patient Instructions (Addendum)
 We have sent the following medications to your pharmacy for you to pick up at your convenience: Suprep   Your provider has requested that you go to the basement level for lab work before leaving today. Press B on the elevator. The lab is located at the first door on the left as you exit the elevator.  You have been scheduled for an endoscopy and colonoscopy. Please follow the written instructions given to you at your visit today.  If you use inhalers (even only as needed), please bring them with you on the day of your procedure.  DO NOT TAKE 7 DAYS PRIOR TO TEST- Trulicity (dulaglutide) Ozempic, Wegovy (semaglutide) Mounjaro, Zepbound (tirzepatide) Bydureon Bcise (exanatide extended release)  DO NOT TAKE 1 DAY PRIOR TO YOUR TEST Rybelsus (semaglutide) Adlyxin (lixisenatide) Victoza (liraglutide) Byetta (exanatide) ___________________________________________________________________________  Due to recent changes in healthcare laws, you may see the results of your imaging and laboratory studies on MyChart before your provider has had a chance to review them.  We understand that in some cases there may be results that are confusing or concerning to you. Not all laboratory results come back in the same time frame and the provider may be waiting for multiple results in order to interpret others.  Please give us  48 hours in order for your provider to thoroughly review all the results before contacting the office for clarification of your results.   _______________________________________________________  If your blood pressure at your visit was 140/90 or greater, please contact your primary care physician to follow up on this.  _______________________________________________________  If you are age 42 or older, your body mass index should be between 23-30. Your Body mass index is 26 kg/m. If this is out of the aforementioned range listed, please consider follow up with your Primary Care  Provider.  If you are age 96 or younger, your body mass index should be between 19-25. Your Body mass index is 26 kg/m. If this is out of the aformentioned range listed, please consider follow up with your Primary Care Provider.   ________________________________________________________  The [facility] providers would like to encourage you to use MYCHART to communicate with providers for non-urgent requests or questions.  Due to long hold times on the telephone, sending your provider a message by [facility], [facility], [facility] may be a faster and more efficient way to get a response.  Please allow 48 business hours for a response.  Please remember that this is for non-urgent requests.  _______________________________________________________  [facility] is using a team-based approach to care.  Your team is made up of your doctor and two to three APPS. Our APPS (Nurse Practitioners and Physician Assistants) work with your physician to ensure care continuity for you. They are fully qualified to address your health concerns and develop a treatment plan. They communicate directly with your gastroenterologist to care for you. Seeing the Advanced Practice Practitioners on your physician's team can help you by facilitating care more promptly, often allowing for earlier appointments, access to diagnostic testing, procedures, and other specialty referrals.   Low FODMAP Diet: (Fermentable Oligosaccharides, Disaccharides, Monosaccharides, and Polyols) These are short chain carbohydrates and sugar alcohols that are poorly absorbed by the body, resulting in multiple abdominal symptoms, including changes in bowel habits, abdominal pain/discomfort, bloating, abdominal distension, gas, etc.     Thank you for choosing me and [facility].

## 2024-09-13 ENCOUNTER — Ambulatory Visit: Payer: Self-pay | Admitting: Gastroenterology

## 2024-09-13 LAB — IGA: Immunoglobulin A: 125 mg/dL (ref 47–310)

## 2024-09-13 LAB — TISSUE TRANSGLUTAMINASE, IGA: (tTG) Ab, IgA: <1 U/mL

## 2024-09-13 NOTE — Progress Notes (Signed)
 ____________________________________________________________  Attending physician addendum:  Thank you for sending this case to me. I have reviewed the entire note and agree with the plan.   Victory Brand, MD  ____________________________________________________________

## 2024-09-16 ENCOUNTER — Other Ambulatory Visit

## 2024-09-16 DIAGNOSIS — R1319 Other dysphagia: Secondary | ICD-10-CM

## 2024-09-16 DIAGNOSIS — R194 Change in bowel habit: Secondary | ICD-10-CM

## 2024-09-16 DIAGNOSIS — R197 Diarrhea, unspecified: Secondary | ICD-10-CM | POA: Diagnosis not present

## 2024-09-16 DIAGNOSIS — R140 Abdominal distension (gaseous): Secondary | ICD-10-CM | POA: Diagnosis not present

## 2024-09-16 DIAGNOSIS — K219 Gastro-esophageal reflux disease without esophagitis: Secondary | ICD-10-CM | POA: Diagnosis not present

## 2024-09-19 LAB — CLOSTRIDIUM DIFFICILE TOXIN B, QUALITATIVE, REAL-TIME PCR: Toxigenic C. Difficile by PCR: NOT DETECTED

## 2024-09-20 LAB — STOOL CULTURE: E coli, Shiga toxin Assay: NEGATIVE

## 2024-09-21 ENCOUNTER — Other Ambulatory Visit: Payer: Self-pay

## 2024-09-21 LAB — OVA AND PARASITE EXAMINATION
CONCENTRATE RESULT:: NONE SEEN
MICRO NUMBER:: 17399546
SPECIMEN QUALITY:: ADEQUATE
TRICHROME RESULT:: NONE SEEN

## 2024-10-11 ENCOUNTER — Encounter: Payer: Self-pay | Admitting: Gastroenterology

## 2024-10-18 ENCOUNTER — Encounter: Admitting: Gastroenterology

## 2024-11-10 ENCOUNTER — Encounter

## 2024-11-24 ENCOUNTER — Encounter: Admitting: Gastroenterology
